# Patient Record
Sex: Female | Born: 1965 | Race: White | Hispanic: No | Smoking: Never smoker
Health system: Southern US, Community
[De-identification: ages and names within clinical notes are randomized; demographics above are authoritative.]

## PROBLEM LIST (undated history)

## (undated) DIAGNOSIS — N926 Irregular menstruation, unspecified: Secondary | ICD-10-CM

## (undated) DIAGNOSIS — I10 Essential (primary) hypertension: Secondary | ICD-10-CM

## (undated) DIAGNOSIS — F32A Depression, unspecified: Secondary | ICD-10-CM

## (undated) DIAGNOSIS — F419 Anxiety disorder, unspecified: Secondary | ICD-10-CM

## (undated) DIAGNOSIS — F329 Major depressive disorder, single episode, unspecified: Secondary | ICD-10-CM

## (undated) DIAGNOSIS — K219 Gastro-esophageal reflux disease without esophagitis: Secondary | ICD-10-CM

## (undated) DIAGNOSIS — E785 Hyperlipidemia, unspecified: Secondary | ICD-10-CM

## (undated) DIAGNOSIS — E079 Disorder of thyroid, unspecified: Secondary | ICD-10-CM

## (undated) HISTORY — DX: Essential (primary) hypertension: I10

## (undated) HISTORY — DX: Irregular menstruation, unspecified: N92.6

## (undated) HISTORY — PX: TUBAL LIGATION: SHX77

## (undated) HISTORY — DX: Major depressive disorder, single episode, unspecified: F32.9

## (undated) HISTORY — PX: ABDOMINAL HYSTERECTOMY: SHX81

## (undated) HISTORY — DX: Gastro-esophageal reflux disease without esophagitis: K21.9

## (undated) HISTORY — PX: ENDOMETRIAL ABLATION: SHX621

## (undated) HISTORY — DX: Depression, unspecified: F32.A

## (undated) HISTORY — PX: CHOLECYSTECTOMY: SHX55

## (undated) HISTORY — PX: DILATION AND CURETTAGE OF UTERUS: SHX78

## (undated) HISTORY — DX: Hyperlipidemia, unspecified: E78.5

## (undated) HISTORY — DX: Disorder of thyroid, unspecified: E07.9

## (undated) HISTORY — DX: Anxiety disorder, unspecified: F41.9

---

## 2006-07-29 ENCOUNTER — Ambulatory Visit (HOSPITAL_COMMUNITY): Admission: RE | Admit: 2006-07-29 | Discharge: 2006-07-29 | Payer: Self-pay | Admitting: Obstetrics & Gynecology

## 2006-07-29 ENCOUNTER — Encounter (INDEPENDENT_AMBULATORY_CARE_PROVIDER_SITE_OTHER): Payer: Self-pay | Admitting: Specialist

## 2006-11-12 ENCOUNTER — Emergency Department (HOSPITAL_COMMUNITY): Admission: EM | Admit: 2006-11-12 | Discharge: 2006-11-12 | Payer: Self-pay | Admitting: Emergency Medicine

## 2007-02-09 ENCOUNTER — Other Ambulatory Visit: Admission: RE | Admit: 2007-02-09 | Discharge: 2007-02-09 | Payer: Self-pay | Admitting: Obstetrics and Gynecology

## 2007-10-07 ENCOUNTER — Emergency Department (HOSPITAL_COMMUNITY): Admission: EM | Admit: 2007-10-07 | Discharge: 2007-10-07 | Payer: Self-pay | Admitting: Emergency Medicine

## 2008-02-14 ENCOUNTER — Other Ambulatory Visit: Admission: RE | Admit: 2008-02-14 | Discharge: 2008-02-14 | Payer: Self-pay | Admitting: Obstetrics and Gynecology

## 2008-07-17 ENCOUNTER — Ambulatory Visit (HOSPITAL_COMMUNITY): Admission: RE | Admit: 2008-07-17 | Discharge: 2008-07-17 | Payer: Self-pay | Admitting: Family Medicine

## 2008-07-19 ENCOUNTER — Ambulatory Visit (HOSPITAL_COMMUNITY): Admission: RE | Admit: 2008-07-19 | Discharge: 2008-07-19 | Payer: Self-pay | Admitting: General Surgery

## 2008-07-20 ENCOUNTER — Encounter (INDEPENDENT_AMBULATORY_CARE_PROVIDER_SITE_OTHER): Payer: Self-pay | Admitting: General Surgery

## 2008-07-20 ENCOUNTER — Observation Stay (HOSPITAL_COMMUNITY): Admission: RE | Admit: 2008-07-20 | Discharge: 2008-07-21 | Payer: Self-pay | Admitting: General Surgery

## 2009-02-22 ENCOUNTER — Other Ambulatory Visit: Admission: RE | Admit: 2009-02-22 | Discharge: 2009-02-22 | Payer: Self-pay | Admitting: Obstetrics and Gynecology

## 2010-05-19 ENCOUNTER — Encounter: Payer: Self-pay | Admitting: Family Medicine

## 2010-08-08 LAB — HEPATIC FUNCTION PANEL
ALT: 21 U/L (ref 0–35)
ALT: 30 U/L (ref 0–35)
AST: 19 U/L (ref 0–37)
Alkaline Phosphatase: 45 U/L (ref 39–117)
Alkaline Phosphatase: 58 U/L (ref 39–117)
Bilirubin, Direct: 0.1 mg/dL (ref 0.0–0.3)
Indirect Bilirubin: 0.3 mg/dL (ref 0.3–0.9)
Indirect Bilirubin: 0.3 mg/dL (ref 0.3–0.9)
Total Bilirubin: 0.4 mg/dL (ref 0.3–1.2)
Total Protein: 5.1 g/dL — ABNORMAL LOW (ref 6.0–8.3)
Total Protein: 6.2 g/dL (ref 6.0–8.3)

## 2010-08-08 LAB — CBC
HCT: 35 % — ABNORMAL LOW (ref 36.0–46.0)
HCT: 38.2 % (ref 36.0–46.0)
Hemoglobin: 12.1 g/dL (ref 12.0–15.0)
MCV: 89.2 fL (ref 78.0–100.0)
MCV: 90.4 fL (ref 78.0–100.0)
Platelets: 258 10*3/uL (ref 150–400)
Platelets: 289 10*3/uL (ref 150–400)
RBC: 3.87 MIL/uL (ref 3.87–5.11)
RBC: 4.28 MIL/uL (ref 3.87–5.11)
WBC: 10 10*3/uL (ref 4.0–10.5)
WBC: 6.7 10*3/uL (ref 4.0–10.5)

## 2010-08-08 LAB — DIFFERENTIAL
Eosinophils Absolute: 0 10*3/uL (ref 0.0–0.7)
Eosinophils Absolute: 0.1 10*3/uL (ref 0.0–0.7)
Eosinophils Relative: 0 % (ref 0–5)
Eosinophils Relative: 2 % (ref 0–5)
Lymphocytes Relative: 26 % (ref 12–46)
Lymphocytes Relative: 32 % (ref 12–46)
Lymphs Abs: 2.1 10*3/uL (ref 0.7–4.0)
Lymphs Abs: 2.6 10*3/uL (ref 0.7–4.0)
Monocytes Absolute: 0.7 10*3/uL (ref 0.1–1.0)
Monocytes Relative: 7 % (ref 3–12)
Monocytes Relative: 7 % (ref 3–12)

## 2010-08-08 LAB — BASIC METABOLIC PANEL
Chloride: 104 mEq/L (ref 96–112)
Chloride: 111 mEq/L (ref 96–112)
GFR calc Af Amer: >60 mL/min (ref >60)
GFR calc Af Amer: >60 mL/min (ref >60)
GFR calc non Af Amer: >60 mL/min (ref >60)
GFR calc non Af Amer: >60 mL/min (ref >60)
Potassium: 4.2 mEq/L (ref 3.5–5.1)
Potassium: 4.5 mEq/L (ref 3.5–5.1)
Sodium: 137 mEq/L (ref 135–145)
Sodium: 140 mEq/L (ref 135–145)

## 2010-08-08 LAB — URINALYSIS, ROUTINE W REFLEX MICROSCOPIC
Bilirubin Urine: NEGATIVE
Ketones, ur: NEGATIVE mg/dL
Nitrite: NEGATIVE
Specific Gravity, Urine: 1.01 (ref 1.005–1.030)
pH: 6 (ref 5.0–8.0)

## 2010-08-08 LAB — URINE MICROSCOPIC-ADD ON

## 2010-08-08 LAB — HCG, QUANTITATIVE, PREGNANCY: hCG, Beta Chain, Quant, S: <2 m[IU]/mL (ref <5)

## 2010-09-10 NOTE — Op Note (Signed)
NAMEHOANG, Nancy [name]            ACCOUNT NO.:  [id number]   MEDICAL RECORD NO.:  [id number]          PATIENT TYPE:  OIB   LOCATION:  A329                          FACILITY:  [facility]   PHYSICIAN:  Barbaraann Barthel, M.D. DATE OF BIRTH:  [birth year]   DATE OF PROCEDURE:  07/20/2008  DATE OF DISCHARGE:                               OPERATIVE REPORT   SURGEON:  Barbaraann Barthel, MD   PREOPERATIVE DIAGNOSES:  Cholecystitis, questionable polyp versus stone  in the gallbladder, final pathology pending.   PROCEDURE:  Laparoscopic cholecystectomy.   SPECIMEN:  Gallbladder.   WOUND CLASSIFICATION:  Contaminated (minimal bile spillage).   NOTE:  This is a 45 year old white female who had at least a year's  complaints of right upper quadrant pain and nausea without vomiting.  She was referred when she was noted on sonogram to have, what appeared  to be, an 8-mm polyp within the gallbladder.  When I swa her in my  office, she also had symptoms of right upper quadrant pain that  suggested to me the possibility of a renal component, because this pain  radiated to her groin.  I thought that her symptoms were very suggestive  of nephrolithiasis.  I did obtain a CT scan.  The CT scan, however, did  not show kidney stones; however, she did have trace blood noted in her  urine.  I suspect that she may have passed a stone and she may have done  this in the past.  This still did not explain entirely her postprandial  pain and nausea and the fact that she had, what appeared to be, an 8-mm  polyp.  I still felt that she needed surgery for this.  I discussed this  in detail with the patient, discussing complications not limited to, but  including bleeding, infection, damage to bile ducts, perforation of  organs, transitory diarrhea, and the possibility of open cholecystectomy  might be necessary.  I also stated that she may have recurrent problems  with pain, because she  may have, as I still suspect,  another component  in the nature of kidney stones.   GROSS OPERATIVE FINDINGS:  In right upper quadrant, there was some small  cystic duct.  This was not cannulated.  The right upper quadrant  otherwise appeared to be grossly within normal limits.  There were  minimal adhesions.   TECHNIQUE:  The patient was placed in supine position.  After the  adequate administration of general anesthesia via endotracheal  intubation, a Foley catheter was aseptically inserted.  Her abdomen was  prepped with Betadine solution and draped in the usual manner.  A  periumbilical incision was carried out over the superior aspect of the  umbilicus.  The fascia was grasped with a sharp towel clip and elevated  and a Veress needle was inserted and confirmed the position with a  saline drop test.  We then used the Visiport technique and an 11-mm  cannula was placed in the umbilicus and then under direct vision, the 11-  mm cannula was placed in the epigastrium and 5-mm cannulas to the right  upper  quadrant laterally.  The gallbladder was grasped, adhesions were  taken down the cystic duct and cystic artery were clearly visualized,  triply silver clipped and divided, and the gallbladder was then removed  with minimal spillage from the liver bed.  This was then exited using  the Endocatch device.  We then checked for hemostasis, deemed complete.  We irrigated.  I elected to leave a piece of Surgicel on the liver bed  as well as a drain which exited from one of the lateral 5-mm cannula  sites.  Prior to closure, all sponge, needle, and instrument counts were  found to be correct.  Estimated blood loss was minimal.  The patient  received a liter of crystalloids intraoperatively.  There were no  complications.       Barbaraann Barthel, M.D.  Electronically Signed     WB/MEDQ  D:  07/20/2008  T:  07/20/2008  Job:  [id number]   cc:   Patrica Duel, M.D.  Fax: [phone]

## 2010-09-13 NOTE — H&P (Signed)
NAME:  Nancy [name], Nancy [name]               ACCOUNT NO.:  [id number]   MEDICAL RECORD NO.:  [id number]          PATIENT TYPE:  AMB   LOCATION:                                FACILITY:  [facility]   PHYSICIAN:  Lazaro Arms, M.D.   DATE OF BIRTH:  19-Sep-1965   DATE OF ADMISSION:  DATE OF DISCHARGE:  LH                              HISTORY & PHYSICAL   Yardley is a 45 year old white female, gravida IV, para II, abortus II,  status post tubal ligation in 2000, who is admitted for a hysteroscopy,  D&C, endometrial ablation.  She bleeds for 10-14 days at a time,  heavily.  She requires pads as well as tampons.  She soils clothes,  sheets, and usually goes through 24 tampons in two days.  Her hemoglobin  in the office was 10.4.  Her uterus is normal size on exam.  She  declines any hormonal management.   PAST MEDICAL HISTORY:  Negative.   PAST SURGICAL HISTORY:  She had a D&C in 1993, tubal ligation in 2000.   PAST OBSTETRICAL HISTORY:  Two vaginal deliveries and two miscarriages.   REVIEW OF SYSTEMS:  Otherwise, negative.   CURRENT MEDICATIONS:  Celexa for premenstrual dysphoric disorder.   PHYSICAL EXAMINATION:  VITAL SIGNS:  Her weight is 193 pounds.  Blood  pressure 130/90.  HEENT:  Unremarkable.  Thyroid is normal.  LUNGS:  Clear.  HEART:  Regular rate and rhythm without murmurs, regurg, or gallops.  BREASTS:  Deferred.  ABDOMEN:  Benign.  No hepatosplenomegaly or masses.  PELVIC:  Exam reveals normal uterus, adnexa.  Vagina is pink and moist.  No discharge.  EXTREMITIES:  Warm.  No edema.  NEUROLOGICAL:  Exam is grossly intact.   IMPRESSION:  1. Menometrorrhagia.  2. Dysmenorrhea.   PLAN:  Patient admitted for hysteroscopy, D&C, endometrial ablation.  She understands risks, benefits, indications, alternatives, will  proceed.      Lazaro Arms, M.D.  Electronically Signed     LHE/MEDQ  D:  07/28/2006  T:  07/29/2006  Job:  [id number]

## 2010-09-13 NOTE — Op Note (Signed)
[name], Nancy [name]               ACCOUNT NO.:  [id number]   MEDICAL RECORD NO.:  [id number]          PATIENT TYPE:  AMB   LOCATION:  DAY                           FACILITY:  [facility]   PHYSICIAN:  Lazaro Arms, M.D.   DATE OF BIRTH:  08-02-1965   DATE OF PROCEDURE:  07/29/2006  DATE OF DISCHARGE:                               OPERATIVE REPORT   PREOPERATIVE DIAGNOSIS:  1. Menometrorrhagia.  2. Dysmenorrhea.   POSTOPERATIVE DIAGNOSIS:  1. Menometrorrhagia.  2. Dysmenorrhea.   PROCEDURE:  Hysteroscopy, dilatation and curettage, endometrial  ablation.   SURGEON:  Lazaro Arms, M.D.   ANESTHESIA:  Laryngeal mask airway.   FINDINGS:  Patient had a normal endometrial cavity.  No polyps, no  myomas, just normal what appeared to be secretory phase endometrium.   DESCRIPTION OF OPERATION:  Patient was taken to the operating room and  placed in supine position, where she underwent laryngeal mask airway  anesthesia.  She was placed in dorsal lithotomy position, prepped and  draped in the usual sterile fashion.  A Graves speculum was placed.  The  cervix was grasped with a single-tooth tenaculum.  The cervix was  dilated serially to allow passage of the hysteroscope.  The hysteroscopy  was performed using normal saline.  Normal endometrial cavity was found.  There were no polyps, no submucosal myomas, and no concern about  hyperplasia.  A vigorous uterine curettage was then performed and  achieved good uterine cry in all areas, and that tissue was sent to  pathology for evaluation.  A ThermaChoice 3 endometrial ablation balloon  was used, 18 mL of D5W was required to maintain a pressure of 190-200  mmHg.  Temperature of 88 degrees Celsius was maintained throughout the  procedure.  The total therapy time was 9 minutes and 17 seconds.  All of  the fluid was returned at the end of the procedure.  The patient  tolerated the procedure well.  She was taken to recovery in good stable  condition.  All counts were correct.  She received Ancef and Toradol  preoperatively.      Lazaro Arms, M.D.  Electronically Signed    LHE/MEDQ  D:  07/29/2006  T:  07/29/2006  Job:  [id number]

## 2011-01-23 LAB — URINALYSIS, ROUTINE W REFLEX MICROSCOPIC
Bilirubin Urine: NEGATIVE
Hgb urine dipstick: NEGATIVE
Ketones, ur: NEGATIVE
Specific Gravity, Urine: 1.015
Urobilinogen, UA: 0.2

## 2011-03-06 ENCOUNTER — Other Ambulatory Visit (HOSPITAL_COMMUNITY)
Admission: RE | Admit: 2011-03-06 | Discharge: 2011-03-06 | Disposition: A | Discharge status: Home / Self Care | Payer: BC Managed Care – PPO | Source: Ambulatory Visit | Attending: Obstetrics and Gynecology | Admitting: Obstetrics and Gynecology

## 2011-03-06 ENCOUNTER — Other Ambulatory Visit: Payer: Self-pay | Admitting: Adult Health

## 2011-03-06 DIAGNOSIS — Z01419 Encounter for gynecological examination (general) (routine) without abnormal findings: Secondary | ICD-10-CM | POA: Insufficient documentation

## 2011-03-10 ENCOUNTER — Other Ambulatory Visit: Payer: Self-pay | Admitting: Adult Health

## 2011-03-10 DIAGNOSIS — Z139 Encounter for screening, unspecified: Secondary | ICD-10-CM

## 2011-03-24 ENCOUNTER — Ambulatory Visit (HOSPITAL_COMMUNITY)
Admission: RE | Admit: 2011-03-24 | Discharge: 2011-03-24 | Disposition: A | Discharge status: Home / Self Care | Payer: BC Managed Care – PPO | Source: Ambulatory Visit | Attending: Adult Health | Admitting: Adult Health

## 2011-03-24 DIAGNOSIS — Z1231 Encounter for screening mammogram for malignant neoplasm of breast: Secondary | ICD-10-CM | POA: Insufficient documentation

## 2011-03-24 DIAGNOSIS — Z139 Encounter for screening, unspecified: Secondary | ICD-10-CM

## 2012-03-08 ENCOUNTER — Other Ambulatory Visit (HOSPITAL_COMMUNITY)
Admission: RE | Admit: 2012-03-08 | Discharge: 2012-03-08 | Disposition: A | Discharge status: Home / Self Care | Payer: BC Managed Care – PPO | Source: Ambulatory Visit | Attending: Obstetrics and Gynecology | Admitting: Obstetrics and Gynecology

## 2012-03-08 ENCOUNTER — Other Ambulatory Visit: Payer: Self-pay | Admitting: Adult Health

## 2012-03-08 DIAGNOSIS — Z1151 Encounter for screening for human papillomavirus (HPV): Secondary | ICD-10-CM | POA: Insufficient documentation

## 2012-03-08 DIAGNOSIS — Z01419 Encounter for gynecological examination (general) (routine) without abnormal findings: Secondary | ICD-10-CM | POA: Insufficient documentation

## 2012-03-09 ENCOUNTER — Other Ambulatory Visit: Payer: Self-pay | Admitting: Adult Health

## 2012-03-09 DIAGNOSIS — Z139 Encounter for screening, unspecified: Secondary | ICD-10-CM

## 2012-03-29 ENCOUNTER — Ambulatory Visit (HOSPITAL_COMMUNITY)
Admission: RE | Admit: 2012-03-29 | Discharge: 2012-03-29 | Disposition: A | Discharge status: Home / Self Care | Payer: BC Managed Care – PPO | Source: Ambulatory Visit | Attending: Adult Health | Admitting: Adult Health

## 2012-03-29 DIAGNOSIS — Z1231 Encounter for screening mammogram for malignant neoplasm of breast: Secondary | ICD-10-CM | POA: Insufficient documentation

## 2012-03-29 DIAGNOSIS — Z139 Encounter for screening, unspecified: Secondary | ICD-10-CM

## 2013-03-10 ENCOUNTER — Ambulatory Visit (INDEPENDENT_AMBULATORY_CARE_PROVIDER_SITE_OTHER): Payer: BC Managed Care – PPO | Admitting: Adult Health

## 2013-03-10 ENCOUNTER — Encounter: Payer: Self-pay | Admitting: Adult Health

## 2013-03-10 VITALS — BP 120/72 | HR 78 | Ht 66.0 in | Wt 245.0 lb

## 2013-03-10 DIAGNOSIS — Z30432 Encounter for removal of intrauterine contraceptive device: Secondary | ICD-10-CM

## 2013-03-10 DIAGNOSIS — N926 Irregular menstruation, unspecified: Secondary | ICD-10-CM

## 2013-03-10 DIAGNOSIS — Z01419 Encounter for gynecological examination (general) (routine) without abnormal findings: Secondary | ICD-10-CM

## 2013-03-10 DIAGNOSIS — Z1212 Encounter for screening for malignant neoplasm of rectum: Secondary | ICD-10-CM

## 2013-03-10 DIAGNOSIS — F419 Anxiety disorder, unspecified: Secondary | ICD-10-CM

## 2013-03-10 HISTORY — DX: Irregular menstruation, unspecified: N92.6

## 2013-03-10 LAB — HEMOCCULT GUIAC POC 1CARD (OFFICE)

## 2013-03-10 MED ORDER — CITALOPRAM HYDROBROMIDE 40 MG PO TABS: 40.0000 mg | ORAL_TABLET | Freq: Every day | ORAL | Status: DC

## 2013-03-10 NOTE — Patient Instructions (Signed)
Physical in 1 year Mammogram yearly Labs at work colonoscopy at 51

## 2013-03-10 NOTE — Progress Notes (Signed)
Patient ID: Nancy [name], female   DOB: October 04, 1965, 47 y.o.   MRN: [id number] History of Present Illness: Nancy [name] is a 47 year old white female, married in for a physical.She had a normal pap with negative HPV 03/08/12.She is having irregular bleeding, and is sp BTL,ablation and has IUD, and wants the IUD removed.Also thinks the celexa not working as well.   Current Medications, Allergies, Past Medical History, Past Surgical History, Family History and Social History were reviewed in [employer] record.     Review of Systems: Patient denies any headaches, blurred vision, shortness of breath, chest pain, abdominal pain, problems with bowel movements, urination, or intercourse. No joint pain, but moods OK but mind races and can't sleep as well.    Physical Exam:BP 120/72  Pulse 78  Ht 5\' 6"  (1.676 m)  Wt 245 lb (111.131 kg)  BMI 39.56 kg/m2  LMP 02/11/2013 General:  Well developed, well nourished, no acute distress Skin:  Warm and dry, tan Neck:  Midline trachea, normal thyroid Lungs; Clear to auscultation bilaterally Breast:  No dominant palpable mass, retraction, or nipple discharge Cardiovascular: Regular rate and rhythm Abdomen:  Soft, non tender, no hepatosplenomegaly Pelvic:  External genitalia is normal in appearance.  The vagina is normal in appearance. The cervix is bulbous. IUD removed with forceps easily. Uterus is felt to be normal size, shape, and contour.  No          adnexal masses or tenderness noted. Rectal: Good sphincter tone, no polyps, or hemorrhoids felt.  Hemoccult negative. Extremities:  No swelling or varicosities noted Psych:  Alert and cooperative,seems happy   Impression: Yearly gyn exam  Irregular bleeding IUD removed Anxiety     Plan: Will increase celexa to 40 mg with 6 refills, if not better in 4-6 weeks call will change meds Physical in 1 year Mammogram yearly Colonoscopy at 51 Labs at work

## 2013-05-23 ENCOUNTER — Ambulatory Visit (INDEPENDENT_AMBULATORY_CARE_PROVIDER_SITE_OTHER): Payer: BC Managed Care – PPO | Admitting: Obstetrics & Gynecology

## 2013-05-23 ENCOUNTER — Encounter: Payer: Self-pay | Admitting: Obstetrics & Gynecology

## 2013-05-23 VITALS — BP 140/100 | Ht 67.0 in | Wt 236.0 lb

## 2013-05-23 DIAGNOSIS — N939 Abnormal uterine and vaginal bleeding, unspecified: Secondary | ICD-10-CM

## 2013-05-23 DIAGNOSIS — N898 Other specified noninflammatory disorders of vagina: Secondary | ICD-10-CM

## 2013-05-23 DIAGNOSIS — N926 Irregular menstruation, unspecified: Secondary | ICD-10-CM

## 2013-05-23 LAB — POCT HEMOGLOBIN: Hemoglobin: 15 g/dL (ref 12.2–16.2)

## 2013-05-23 MED ORDER — MEGESTROL ACETATE 40 MG PO TABS: ORAL_TABLET | ORAL | Status: DC

## 2013-05-23 NOTE — Progress Notes (Signed)
Patient ID: Aaira Oestreicher, female   DOB: November 22, 1965, 48 y.o.   MRN: [id number] Pt has bled "42 out of last 74 days" since she got her IUD out S/p ablation in 2008 and had IUD to manage bleeding beyond that  Will place on megestrol and follow up with sonogram in 1 month and decide based on that what to do going forward  Past Medical History  Diagnosis Date  . Anxiety   . Irregular bleeding 03/10/2013    Past Surgical History  Procedure Laterality Date  . Tubal ligation    . Dilation and curettage of uterus    . Endometrial ablation      OB History   Grav Para Term Preterm Abortions TAB SAB Ect Mult Living   4 2   2  2   2       No Known Allergies  History   Social History  . Marital Status: Divorced    Spouse Name: N/A    Number of Children: N/A  . Years of Education: N/A   Social History Main Topics  . Smoking status: Never Smoker   . Smokeless tobacco: Never Used  . Alcohol Use: No  . Drug Use: No  . Sexual Activity: Yes    Birth Control/ Protection: Surgical   Other Topics Concern  . None   Social History Narrative  . None    Family History  Problem Relation Age of Onset  . Heart disease Mother   . COPD Mother   . Heart attack Father   . Cancer Father     lung and renal  . Hypertension Maternal Grandmother   . Heart disease Maternal Grandmother   . Hypertension Maternal Grandfather   . Heart disease Maternal Grandfather   . Hypertension Paternal Grandmother   . Heart disease Paternal Grandmother   . Hypertension Paternal Grandfather   . Heart disease Paternal Grandfather   . Cancer Sister     lung,renal

## 2013-06-23 ENCOUNTER — Other Ambulatory Visit: Payer: BC Managed Care – PPO

## 2013-06-23 ENCOUNTER — Ambulatory Visit: Payer: BC Managed Care – PPO | Admitting: Obstetrics & Gynecology

## 2013-06-27 ENCOUNTER — Encounter (HOSPITAL_COMMUNITY): Payer: Self-pay | Admitting: Pharmacy Technician

## 2013-06-27 ENCOUNTER — Ambulatory Visit (INDEPENDENT_AMBULATORY_CARE_PROVIDER_SITE_OTHER): Payer: BC Managed Care – PPO | Admitting: Obstetrics & Gynecology

## 2013-06-27 ENCOUNTER — Encounter (INDEPENDENT_AMBULATORY_CARE_PROVIDER_SITE_OTHER): Payer: Self-pay

## 2013-06-27 ENCOUNTER — Other Ambulatory Visit: Payer: Self-pay | Admitting: Obstetrics & Gynecology

## 2013-06-27 ENCOUNTER — Encounter: Payer: Self-pay | Admitting: Obstetrics & Gynecology

## 2013-06-27 ENCOUNTER — Ambulatory Visit (INDEPENDENT_AMBULATORY_CARE_PROVIDER_SITE_OTHER): Payer: BC Managed Care – PPO

## 2013-06-27 VITALS — BP 120/84 | Ht 67.0 in | Wt 234.0 lb

## 2013-06-27 DIAGNOSIS — N898 Other specified noninflammatory disorders of vagina: Secondary | ICD-10-CM

## 2013-06-27 DIAGNOSIS — N939 Abnormal uterine and vaginal bleeding, unspecified: Secondary | ICD-10-CM

## 2013-06-27 DIAGNOSIS — N920 Excessive and frequent menstruation with regular cycle: Secondary | ICD-10-CM

## 2013-06-27 DIAGNOSIS — N946 Dysmenorrhea, unspecified: Secondary | ICD-10-CM

## 2013-06-27 DIAGNOSIS — D219 Benign neoplasm of connective and other soft tissue, unspecified: Secondary | ICD-10-CM

## 2013-06-27 DIAGNOSIS — D259 Leiomyoma of uterus, unspecified: Secondary | ICD-10-CM

## 2013-06-27 DIAGNOSIS — N926 Irregular menstruation, unspecified: Secondary | ICD-10-CM

## 2013-06-27 DIAGNOSIS — Z01818 Encounter for other preprocedural examination: Secondary | ICD-10-CM

## 2013-06-27 NOTE — Progress Notes (Signed)
Patient ID: Nancy [name], female   DOB: 10/24/1965, 48 y.o.   MRN: [id number] See sonogram report    US Transvaginal Non-ob  06/27/2013   GYNECOLOGIC SONOGRAM   Nancy [name] is a 48 y.o. [id number] LMP 06/17/2013 for a pelvic sonogram  for DUB and is presently taking Megace. Pt with H/O Endometrial Ablation,  BTL and D&C.  Uterus                      15.1 x 10.9 x 10.6 cm, retroflexed uterus with  multiple fibroids noted (6 in # and largest 2 = 48 & 55mm)  Endometrium          9.6 mm, distorted by multiple fibroids  Right ovary             2.9 x 2.0 x 1.6 cm,   Left ovary                2.6 x 2.0 x 1.8 cm,   No free fluid or adnexal masses noted within pelvis  Technician Comments:  Retroflexed uterus noted with multiple fibroids, Endometrial cavity  distorted by fibroids, bilateral adnexa/ovaries appears wnl, no free fluid  or adnexal masses noted within pelvis   Nancy [name] 06/27/2013 10:04 AM  Clinical Impression and recommendations:  I have reviewed the sonogram results above, combined with the patient's  current clinical course, below are my impressions and any appropriate  recommendations for management based on the sonographic findings.  Multiple fibroids with submucosal location as well, which is the reason  for her bleeding No other anormalities  EURE,LUTHER H 06/27/2013 10:29 AM      Exam  Uterus feels about 14-16 weeks size, not option for vaginal or laparoscopic approach  Proceed with abdominal supracervical hysterectomy with removal of tubes and ovaries  Past Medical History  Diagnosis Date  . Anxiety   . Irregular bleeding 03/10/2013    Past Surgical History  Procedure Laterality Date  . Tubal ligation    . Dilation and curettage of uterus    . Endometrial ablation      OB History   Grav Para Term Preterm Abortions TAB SAB Ect Mult Living   4 2   2  2   2       No Known Allergies  History   Social History  . Marital Status: Divorced    Spouse Name: N/A    Number of  Children: N/A  . Years of Education: N/A   Social History Main Topics  . Smoking status: Never Smoker   . Smokeless tobacco: Never Used  . Alcohol Use: No  . Drug Use: No  . Sexual Activity: Yes    Birth Control/ Protection: Surgical   Other Topics Concern  . None   Social History Narrative  . None    Family History  Problem Relation Age of Onset  . Heart disease Mother   . COPD Mother   . Heart attack Father   . Cancer Father     lung and renal  . Hypertension Maternal Grandmother   . Heart disease Maternal Grandmother   . Hypertension Maternal Grandfather   . Heart disease Maternal Grandfather   . Hypertension Paternal Grandmother   . Heart disease Paternal Grandmother   . Hypertension Paternal Grandfather   . Heart disease Paternal Grandfather   . Cancer Sister     lung,renal

## 2013-06-29 NOTE — Patient Instructions (Signed)
Nancy [name]  06/29/2013   Your procedure is scheduled on:  07/06/2013  Report to [facility] at  700  AM.  Call this number if you have problems the morning of surgery: [phone]   Remember:   Do not eat food or drink liquids after midnight.   Take these medicines the morning of surgery with A SIP OF WATER: none   Do not wear jewelry, make-up or nail polish.  Do not wear lotions, powders, or perfumes.   Do not shave 48 hours prior to surgery. Men may shave face and neck.  Do not bring valuables to the hospital.  [facility] is not responsible for any belongings or valuables.               Contacts, dentures or bridgework may not be worn into surgery.  Leave suitcase in the car. After surgery it may be brought to your room.  For patients admitted to the hospital, discharge time is determined by your treatment team.               Patients discharged the day of surgery will not be allowed to drive home.  Name and phone number of your driver: family  Special Instructions: Shower using CHG 2 nights before surgery and the night before surgery.  If you shower the day of surgery use CHG.  Use special wash - you have one bottle of CHG for all showers.  You should use approximately 1/3 of the bottle for each shower.   Please read over the following fact sheets that you were given: Pain Booklet, Coughing and Deep Breathing, Surgical Site Infection Prevention, Anesthesia Post-op Instructions and Care and Recovery After Surgery Bilateral Salpingo-Oophorectomy Bilateral salpingo-oophorectomy is the surgical removal of both fallopian tubes and both ovaries. The ovaries are small organs that produce eggs in women. The fallopian tubes transport the egg from the ovary to the womb (uterus). Usually, when this surgery is done, the uterus was previously removed. A bilateral salpingo-oophorectomy may be done to treat cancer or to reduce the risk of cancer in women who are at high  risk. Removing both fallopian tubes and both ovaries will make you unable to become pregnant (sterile). It will also put you into menopause so that you will no longer have menstrual periods and may have menopausal symptoms such as hot flashes, night sweats, and mood changes. It will not affect your sex drive. LET [facility] CARE PROVIDER KNOW ABOUT:  Any allergies you have.  All medicines you are taking, including vitamins, herbs, eye drops, creams, and over-the-counter medicines.  Previous problems you or members of your family have had with the use of anesthetics.  Any blood disorders you have.  Previous surgeries you have had.  Medical conditions you have. RISKS AND COMPLICATIONS Generally, this is a safe procedure. However, as with any procedure, complications can occur. Possible complications include:  Injury to surrounding organs.  Bleeding.  Infection.  Blood clots in the legs or lungs.  Problems related to anesthesia. BEFORE THE PROCEDURE  Ask your health care provider about changing or stopping your regular medicines. You may need to stop taking certain medicines, such as aspirin or blood thinners, at least 1 week before the surgery.  Do not eat or drink anything for at least 8 hours before the surgery.  If you smoke, do not smoke for at least 2 weeks before the surgery.  Make plans to have someone drive you  home after the procedure or after your hospital stay. Also arrange for someone to help you with activities during recovery. PROCEDURE   You will be given medicine to help you relax before the procedure (sedative). You will then be given medicine to make you sleep through the procedure (general anesthetic). These medicines will be given through an IV access tube that is put into one of your veins.  Once you are asleep, your lower abdomen will be shaved and cleaned. A thin, flexible tube (catheter) will be placed in your bladder.  The surgeon may use a  laparoscopic, robotic, or open technique for this surgery:  In the laparoscopic technique, the surgery is done through two small cuts (incisions) in the abdomen. A thin, lighted tube with a tiny camera on the end (laparoscope) is inserted into one of the incisions. The tools needed for the procedure are put through the other incision.  A robotic technique may be chosen to perform complex surgery in a small space. In the robotic technique, small incisions will be made. A camera and surgical instruments are passed through the incisions. Surgical instruments will be controlled with the help of a robotic arm.  In the open technique, the surgery is done through one large incision in the abdomen.  Using any of these techniques, the surgeon removes the fallopian tubes and ovaries. The blood vessels will be clamped and tied.  The surgeon then uses staples or stitches to close the incision or incisions. AFTER THE PROCEDURE  You will be taken to a recovery area where you will be monitored for 1 to 3 hours. Your blood pressure, pulse, and temperature will be checked often. You will remain in the recovery area until you are stable and waking up.  If the laparoscopic technique was used, you may be allowed to go home after several hours. You may have some shoulder pain after the laparoscopic procedure. This is normal and usually goes away in a day or two.  If the open technique was used, you will be admitted to the hospital for a couple of days.  You will be given pain medicine as needed.  The IV access tube and catheter will be removed before you are discharged. Document Released: 04/14/2005 Document Revised: 12/15/2012 Document Reviewed: 10/06/2012 [facility] Patient Information 2014 [place]. Supracervical Hysterectomy A supracervical hysterectomy is surgery to remove the top part of the uterus, but not the cervix. You will no longer have menstrual periods or be able to get pregnant after this  surgery. The fallopian tubes and ovaries may also be removed (bilateral salpingo-oophorectomy) during this surgery. This surgery is usually performed using a minimally invasive technique called laparoscopy. This technique allows the surgery to be done through small incisions. The minimally invasive technique provides benefits such as less pain, less risk of infection, and shorter recovery time. LET [facility] CARE PROVIDER KNOW ABOUT:  Any allergies you have.  All medicines you are taking, including vitamins, herbs, eye drops, creams, and over-the-counter medicines.  Previous problems you or members of your family have had with the use of anesthetics.  Any blood disorders you have.  Previous surgeries you have had.  Medical conditions you have. RISKS AND COMPLICATIONS  Generally, this is a safe procedure. However, as with any procedure, complications can occur. Possible complications include:  Bleeding.  Blood clots in the legs or lung.  Infection.  Injury to surrounding organs.  Problems related to anesthesia.  Conversion to an open abdominal surgery.  Additional surgery  later to remove the cervix if you have problems with the cervix. BEFORE THE PROCEDURE  Ask your health care provider about changing or stopping your regular medicines.  Do not take aspirin or blood thinners (anticoagulants) for 1 week before the surgery, or as directed by your health care provider.  Do not eat or drink anything for 8 hours before the surgery, or as directed by your health care provider.  Quit smoking if you smoke.  Arrange for a ride home after surgery and for someone to help you at home during recovery. PROCEDURE   You will be given an antibiotic medicine.  An IV tube will be placed in one of your veins. You will be given medicine to make you sleep (general anesthetic).  A gas (carbon dioxide) will be used to inflate your abdomen. This will allow your surgeon to look inside your  abdomen, perform your surgery, and treat any other problems found if necessary.  Three or four small incisions will be made in your abdomen. One of these incisions will be made in the area of your belly button (navel). A thin, flexible tube with a tiny camera and light on the end of it (laparoscope) will be inserted into the incision. The camera on the laparoscope sends a picture to a TV screen in the operating room. This gives your surgeon a good view inside the abdomen.  Other surgical instruments will be inserted through the other incisions.  The uterus will be cut into small pieces and removed through the small incisions.  Your incisions will be closed. AFTER THE PROCEDURE   You will be taken to a recovery area where your progress will be monitored until you are awake, stable, and taking fluids well. If there are no other problems, you will then be moved to a regular hospital room, or you will be allowed to go home.  You will likely have minimal discomfort after the surgery because the incisions are so small with the laparoscopic technique.  You will be given pain medicine while you are in the hospital and for when you go home.  If a bilateral salpingo-oophorectomy was performed before menopause, you will go through a sudden (abrupt) menopause. This can be helped with hormone medicines. Document Released: 10/01/2007 Document Revised: 02/02/2013 Document Reviewed: 10/15/2012 [facility] Patient Information 2014 [place]. PATIENT INSTRUCTIONS POST-ANESTHESIA  IMMEDIATELY FOLLOWING SURGERY:  Do not drive or operate machinery for the first twenty four hours after surgery.  Do not make any important decisions for twenty four hours after surgery or while taking narcotic pain medications or sedatives.  If you develop intractable nausea and vomiting or a severe headache please notify your doctor immediately.  FOLLOW-UP:  Please make an appointment with your surgeon as instructed. You do not  need to follow up with anesthesia unless specifically instructed to do so.  WOUND CARE INSTRUCTIONS (if applicable):  Keep a dry clean dressing on the anesthesia/puncture wound site if there is drainage.  Once the wound has quit draining you may leave it open to air.  Generally you should leave the bandage intact for twenty four hours unless there is drainage.  If the epidural site drains for more than 36-48 hours please call the anesthesia department.  QUESTIONS?:  Please feel free to call your physician or the hospital operator if you have any questions, and they will be happy to assist you.

## 2013-06-30 ENCOUNTER — Encounter (HOSPITAL_COMMUNITY)
Admission: RE | Admit: 2013-06-30 | Discharge: 2013-06-30 | Disposition: A | Discharge status: Home / Self Care | Payer: BC Managed Care – PPO | Source: Ambulatory Visit | Attending: Obstetrics & Gynecology | Admitting: Obstetrics & Gynecology

## 2013-06-30 DIAGNOSIS — Z01812 Encounter for preprocedural laboratory examination: Secondary | ICD-10-CM | POA: Insufficient documentation

## 2013-06-30 LAB — COMPREHENSIVE METABOLIC PANEL
ALT: 42 U/L — AB (ref 0–35)
AST: 24 U/L (ref 0–37)
Albumin: 4.1 g/dL (ref 3.5–5.2)
Alkaline Phosphatase: 46 U/L (ref 39–117)
BUN: 9 mg/dL (ref 6–23)
CO2: 26 meq/L (ref 19–32)
CREATININE: 0.77 mg/dL (ref 0.50–1.10)
Calcium: 9.9 mg/dL (ref 8.4–10.5)
Chloride: 107 mEq/L (ref 96–112)
GFR calc Af Amer: >90 mL/min (ref >90)
Glucose, Bld: 104 mg/dL — ABNORMAL HIGH (ref 70–99)
POTASSIUM: 4.5 meq/L (ref 3.7–5.3)
Sodium: 144 mEq/L (ref 137–147)
Total Bilirubin: 0.3 mg/dL (ref 0.3–1.2)
Total Protein: 7.1 g/dL (ref 6.0–8.3)

## 2013-06-30 LAB — URINE MICROSCOPIC-ADD ON

## 2013-06-30 LAB — CBC
HCT: 39 % (ref 36.0–46.0)
Hemoglobin: 13.9 g/dL (ref 12.0–15.0)
MCH: 30.9 pg (ref 26.0–34.0)
MCHC: 35.6 g/dL (ref 30.0–36.0)
MCV: 86.7 fL (ref 78.0–100.0)
PLATELETS: 243 10*3/uL (ref 150–400)
RBC: 4.5 MIL/uL (ref 3.87–5.11)
RDW: 12.6 % (ref 11.5–15.5)
WBC: 7.3 10*3/uL (ref 4.0–10.5)

## 2013-06-30 LAB — URINALYSIS, ROUTINE W REFLEX MICROSCOPIC
Bilirubin Urine: NEGATIVE
Glucose, UA: NEGATIVE mg/dL
Ketones, ur: NEGATIVE mg/dL
LEUKOCYTES UA: NEGATIVE
Nitrite: NEGATIVE
PH: 5.5 (ref 5.0–8.0)
Protein, ur: NEGATIVE mg/dL
Urobilinogen, UA: 0.2 mg/dL (ref 0.0–1.0)

## 2013-06-30 LAB — TYPE AND SCREEN
ABO/RH(D): A POS
Antibody Screen: NEGATIVE

## 2013-06-30 LAB — HCG, QUANTITATIVE, PREGNANCY: hCG, Beta Chain, Quant, S: <1 m[IU]/mL (ref <5)

## 2013-07-06 ENCOUNTER — Observation Stay (HOSPITAL_COMMUNITY)
Admission: RE | Admit: 2013-07-06 | Discharge: 2013-07-07 | Disposition: A | Discharge status: Home / Self Care | Payer: BC Managed Care – PPO | Source: Ambulatory Visit | Attending: Obstetrics & Gynecology | Admitting: Obstetrics & Gynecology

## 2013-07-06 ENCOUNTER — Encounter (HOSPITAL_COMMUNITY): Payer: BC Managed Care – PPO | Admitting: Anesthesiology

## 2013-07-06 ENCOUNTER — Telehealth: Payer: Self-pay | Admitting: Obstetrics & Gynecology

## 2013-07-06 ENCOUNTER — Encounter (HOSPITAL_COMMUNITY): Payer: Self-pay | Admitting: *Deleted

## 2013-07-06 ENCOUNTER — Ambulatory Visit (HOSPITAL_COMMUNITY): Payer: BC Managed Care – PPO | Admitting: Anesthesiology

## 2013-07-06 ENCOUNTER — Encounter (HOSPITAL_COMMUNITY)
Admission: RE | Disposition: A | Discharge status: Home / Self Care | Payer: Self-pay | Source: Ambulatory Visit | Attending: Obstetrics & Gynecology

## 2013-07-06 DIAGNOSIS — N920 Excessive and frequent menstruation with regular cycle: Secondary | ICD-10-CM | POA: Insufficient documentation

## 2013-07-06 DIAGNOSIS — D259 Leiomyoma of uterus, unspecified: Principal | ICD-10-CM | POA: Insufficient documentation

## 2013-07-06 DIAGNOSIS — N938 Other specified abnormal uterine and vaginal bleeding: Secondary | ICD-10-CM

## 2013-07-06 DIAGNOSIS — N801 Endometriosis of ovary: Secondary | ICD-10-CM

## 2013-07-06 DIAGNOSIS — F411 Generalized anxiety disorder: Secondary | ICD-10-CM | POA: Insufficient documentation

## 2013-07-06 DIAGNOSIS — N852 Hypertrophy of uterus: Secondary | ICD-10-CM

## 2013-07-06 DIAGNOSIS — N80109 Endometriosis of ovary, unspecified side, unspecified depth: Secondary | ICD-10-CM

## 2013-07-06 DIAGNOSIS — N946 Dysmenorrhea, unspecified: Secondary | ICD-10-CM

## 2013-07-06 DIAGNOSIS — N838 Other noninflammatory disorders of ovary, fallopian tube and broad ligament: Secondary | ICD-10-CM

## 2013-07-06 DIAGNOSIS — Z90710 Acquired absence of both cervix and uterus: Secondary | ICD-10-CM

## 2013-07-06 DIAGNOSIS — D251 Intramural leiomyoma of uterus: Secondary | ICD-10-CM

## 2013-07-06 DIAGNOSIS — N949 Unspecified condition associated with female genital organs and menstrual cycle: Secondary | ICD-10-CM

## 2013-07-06 HISTORY — PX: SUPRACERVICAL ABDOMINAL HYSTERECTOMY: SHX5393

## 2013-07-06 HISTORY — PX: SALPINGOOPHORECTOMY: SHX82

## 2013-07-06 SURGERY — HYSTERECTOMY, SUPRACERVICAL, ABDOMINAL
Anesthesia: General

## 2013-07-06 MED ORDER — KCL IN DEXTROSE-NACL 20-5-0.45 MEQ/L-%-% IV SOLN
INTRAVENOUS | Status: DC
  Administered 2013-07-06 – 2013-07-07 (×2): via INTRAVENOUS

## 2013-07-06 MED ORDER — LIDOCAINE HCL (CARDIAC) 10 MG/ML IV SOLN
INTRAVENOUS | Status: DC | PRN
  Administered 2013-07-06: 20 mg via INTRAVENOUS

## 2013-07-06 MED ORDER — ONDANSETRON 8 MG/NS 50 ML IVPB
8.0000 mg | Freq: Four times a day (QID) | INTRAVENOUS | Status: DC | PRN
  Filled 2013-07-06: qty 8

## 2013-07-06 MED ORDER — SODIUM CHLORIDE 0.9 % IR SOLN
Status: DC | PRN
  Administered 2013-07-06: 2000 mL

## 2013-07-06 MED ORDER — FENTANYL CITRATE 0.05 MG/ML IJ SOLN
INTRAMUSCULAR | Status: AC
  Filled 2013-07-06: qty 5

## 2013-07-06 MED ORDER — CEFAZOLIN SODIUM-DEXTROSE 2-3 GM-% IV SOLR
2.0000 g | INTRAVENOUS | Status: AC
  Administered 2013-07-06: 2 g via INTRAVENOUS

## 2013-07-06 MED ORDER — PROPOFOL 10 MG/ML IV BOLUS
INTRAVENOUS | Status: DC | PRN
  Administered 2013-07-06: 160 mg via INTRAVENOUS
  Administered 2013-07-06: 40 mg via INTRAVENOUS

## 2013-07-06 MED ORDER — BUPIVACAINE LIPOSOME 1.3 % IJ SUSP
INTRAMUSCULAR | Status: DC | PRN
  Administered 2013-07-06: 60 mL

## 2013-07-06 MED ORDER — PROPOFOL 10 MG/ML IV EMUL
INTRAVENOUS | Status: AC
  Filled 2013-07-06: qty 20

## 2013-07-06 MED ORDER — ONDANSETRON HCL 4 MG/2ML IJ SOLN
4.0000 mg | Freq: Once | INTRAMUSCULAR | Status: AC
  Administered 2013-07-06: 4 mg via INTRAVENOUS

## 2013-07-06 MED ORDER — FENTANYL CITRATE 0.05 MG/ML IJ SOLN
25.0000 ug | INTRAMUSCULAR | Status: DC | PRN
  Administered 2013-07-06 (×4): 50 ug via INTRAVENOUS
  Filled 2013-07-06: qty 2

## 2013-07-06 MED ORDER — NEOSTIGMINE METHYLSULFATE 1 MG/ML IJ SOLN
INTRAMUSCULAR | Status: DC | PRN
  Administered 2013-07-06: 1 mg via INTRAVENOUS
  Administered 2013-07-06: 2 mg via INTRAVENOUS

## 2013-07-06 MED ORDER — MIDAZOLAM HCL 2 MG/2ML IJ SOLN
INTRAMUSCULAR | Status: AC
  Filled 2013-07-06: qty 2

## 2013-07-06 MED ORDER — ROCURONIUM BROMIDE 100 MG/10ML IV SOLN
INTRAVENOUS | Status: DC | PRN
  Administered 2013-07-06: 10 mg via INTRAVENOUS
  Administered 2013-07-06: 45 mg via INTRAVENOUS
  Administered 2013-07-06: 5 mg via INTRAVENOUS

## 2013-07-06 MED ORDER — OXYCODONE-ACETAMINOPHEN 5-325 MG PO TABS
1.0000 | ORAL_TABLET | ORAL | Status: DC | PRN
  Administered 2013-07-06 – 2013-07-07 (×5): 2 via ORAL
  Filled 2013-07-06 (×5): qty 2

## 2013-07-06 MED ORDER — SODIUM CHLORIDE 0.9 % IJ SOLN
INTRAMUSCULAR | Status: AC
  Filled 2013-07-06: qty 40

## 2013-07-06 MED ORDER — KETOROLAC TROMETHAMINE 30 MG/ML IJ SOLN
INTRAMUSCULAR | Status: AC
  Filled 2013-07-06: qty 1

## 2013-07-06 MED ORDER — ROCURONIUM BROMIDE 50 MG/5ML IV SOLN
INTRAVENOUS | Status: AC
  Filled 2013-07-06: qty 1

## 2013-07-06 MED ORDER — BUPIVACAINE LIPOSOME 1.3 % IJ SUSP
20.0000 mL | Freq: Once | INTRAMUSCULAR | Status: DC
  Filled 2013-07-06: qty 20

## 2013-07-06 MED ORDER — HYDROMORPHONE HCL PF 1 MG/ML IJ SOLN: 1.0000 mg | INTRAMUSCULAR | Status: DC | PRN

## 2013-07-06 MED ORDER — GLYCOPYRROLATE 0.2 MG/ML IJ SOLN
INTRAMUSCULAR | Status: DC | PRN
  Administered 2013-07-06: 0.4 mg via INTRAVENOUS
  Administered 2013-07-06: 0.2 mg via INTRAVENOUS

## 2013-07-06 MED ORDER — LIDOCAINE HCL (PF) 1 % IJ SOLN
INTRAMUSCULAR | Status: AC
  Filled 2013-07-06: qty 5

## 2013-07-06 MED ORDER — GLYCOPYRROLATE 0.2 MG/ML IJ SOLN
INTRAMUSCULAR | Status: AC
  Filled 2013-07-06: qty 2

## 2013-07-06 MED ORDER — NEOSTIGMINE METHYLSULFATE 1 MG/ML IJ SOLN
INTRAMUSCULAR | Status: AC
  Filled 2013-07-06: qty 1

## 2013-07-06 MED ORDER — ONDANSETRON HCL 4 MG PO TABS
8.0000 mg | ORAL_TABLET | Freq: Four times a day (QID) | ORAL | Status: DC | PRN
  Administered 2013-07-06: 8 mg via ORAL
  Filled 2013-07-06: qty 2

## 2013-07-06 MED ORDER — KETOROLAC TROMETHAMINE 30 MG/ML IJ SOLN
30.0000 mg | Freq: Once | INTRAMUSCULAR | Status: AC
  Administered 2013-07-06: 30 mg via INTRAVENOUS

## 2013-07-06 MED ORDER — MIDAZOLAM HCL 2 MG/2ML IJ SOLN
1.0000 mg | INTRAMUSCULAR | Status: DC | PRN
  Administered 2013-07-06: 2 mg via INTRAVENOUS

## 2013-07-06 MED ORDER — MIDAZOLAM HCL 5 MG/5ML IJ SOLN
INTRAMUSCULAR | Status: DC | PRN
  Administered 2013-07-06: 2 mg via INTRAVENOUS

## 2013-07-06 MED ORDER — LACTATED RINGERS IV SOLN
INTRAVENOUS | Status: DC
  Administered 2013-07-06: 1000 mL via INTRAVENOUS
  Administered 2013-07-06 (×2): via INTRAVENOUS

## 2013-07-06 MED ORDER — CEFAZOLIN SODIUM-DEXTROSE 2-3 GM-% IV SOLR
INTRAVENOUS | Status: AC
  Filled 2013-07-06: qty 50

## 2013-07-06 MED ORDER — FENTANYL CITRATE 0.05 MG/ML IJ SOLN
INTRAMUSCULAR | Status: DC | PRN
  Administered 2013-07-06 (×2): 25 ug via INTRAVENOUS
  Administered 2013-07-06 (×3): 50 ug via INTRAVENOUS
  Administered 2013-07-06 (×5): 25 ug via INTRAVENOUS
  Administered 2013-07-06: 100 ug via INTRAVENOUS

## 2013-07-06 MED ORDER — DOCUSATE SODIUM 100 MG PO CAPS
100.0000 mg | ORAL_CAPSULE | Freq: Two times a day (BID) | ORAL | Status: DC
  Administered 2013-07-06 – 2013-07-07 (×3): 100 mg via ORAL
  Filled 2013-07-06 (×3): qty 1

## 2013-07-06 MED ORDER — ALUM & MAG HYDROXIDE-SIMETH 200-200-20 MG/5ML PO SUSP
30.0000 mL | ORAL | Status: DC | PRN
  Administered 2013-07-06 – 2013-07-07 (×2): 30 mL via ORAL
  Filled 2013-07-06 (×2): qty 30

## 2013-07-06 MED ORDER — ONDANSETRON HCL 4 MG/2ML IJ SOLN
INTRAMUSCULAR | Status: AC
  Filled 2013-07-06: qty 2

## 2013-07-06 MED ORDER — KETOROLAC TROMETHAMINE 30 MG/ML IJ SOLN
30.0000 mg | Freq: Once | INTRAMUSCULAR | Status: AC
  Administered 2013-07-06: 30 mg via INTRAVENOUS
  Filled 2013-07-06: qty 1

## 2013-07-06 MED ORDER — ONDANSETRON HCL 4 MG/2ML IJ SOLN: 4.0000 mg | Freq: Once | INTRAMUSCULAR | Status: DC | PRN

## 2013-07-06 MED ORDER — GLYCOPYRROLATE 0.2 MG/ML IJ SOLN
INTRAMUSCULAR | Status: AC
  Filled 2013-07-06: qty 1

## 2013-07-06 SURGICAL SUPPLY — 63 items
ADH SKN CLS APL DERMABOND .7 (GAUZE/BANDAGES/DRESSINGS) ×4
APPLIER CLIP 13 LRG OPEN (CLIP)
APR CLP LRG 13 20 CLIP (CLIP)
BAG HAMPER (MISCELLANEOUS) ×4 IMPLANT
BRR ADH 6X5 SEPRAFILM 1 SHT (MISCELLANEOUS) ×2
CELLS DAT CNTRL 66122 CELL SVR (MISCELLANEOUS) IMPLANT
CLIP APPLIE 13 LRG OPEN (CLIP) IMPLANT
CLOTH BEACON ORANGE TIMEOUT ST (SAFETY) ×4 IMPLANT
COVER LIGHT HANDLE STERIS (MISCELLANEOUS) ×8 IMPLANT
DERMABOND ADVANCED (GAUZE/BANDAGES/DRESSINGS) ×4
DERMABOND ADVANCED .7 DNX12 (GAUZE/BANDAGES/DRESSINGS) ×4 IMPLANT
DRAPE WARM FLUID 44X44 (DRAPE) ×4 IMPLANT
DRESSING TELFA 8X3 (GAUZE/BANDAGES/DRESSINGS) ×2 IMPLANT
ELECT REM PT RETURN 9FT ADLT (ELECTROSURGICAL) ×4
ELECTRODE REM PT RTRN 9FT ADLT (ELECTROSURGICAL) ×2 IMPLANT
FORMALIN 10 PREFIL 480ML (MISCELLANEOUS) ×4 IMPLANT
GLOVE BIOGEL PI IND STRL 7.0 (GLOVE) IMPLANT
GLOVE BIOGEL PI IND STRL 7.5 (GLOVE) IMPLANT
GLOVE BIOGEL PI IND STRL 8 (GLOVE) ×2 IMPLANT
GLOVE BIOGEL PI INDICATOR 7.0 (GLOVE) ×6
GLOVE BIOGEL PI INDICATOR 7.5 (GLOVE) ×4
GLOVE BIOGEL PI INDICATOR 8 (GLOVE) ×2
GLOVE ECLIPSE 7.0 STRL STRAW (GLOVE) ×2 IMPLANT
GLOVE ECLIPSE 8.0 STRL XLNG CF (GLOVE) ×4 IMPLANT
GLOVE SS BIOGEL STRL SZ 6.5 (GLOVE) IMPLANT
GLOVE SUPERSENSE BIOGEL SZ 6.5 (GLOVE) ×2
GOWN SRG XL XLNG 56XLVL 4 (GOWN DISPOSABLE) ×2 IMPLANT
GOWN STRL NON-REIN XL XLG LVL4 (GOWN DISPOSABLE)
GOWN STRL REUS W/TWL LRG LVL3 (GOWN DISPOSABLE) ×10 IMPLANT
GOWN STRL REUS W/TWL XL LVL3 (GOWN DISPOSABLE) ×4 IMPLANT
INST SET MAJOR GENERAL (KITS) ×4 IMPLANT
KIT ROOM TURNOVER APOR (KITS) ×4 IMPLANT
MANIFOLD NEPTUNE II (INSTRUMENTS) ×4 IMPLANT
NDL HYPO 18GX1.5 BLUNT FILL (NEEDLE) IMPLANT
NDL HYPO 25X1 1.5 SAFETY (NEEDLE) IMPLANT
NEEDLE HYPO 18GX1.5 BLUNT FILL (NEEDLE) ×8 IMPLANT
NEEDLE HYPO 25X1 1.5 SAFETY (NEEDLE) ×4 IMPLANT
NS IRRIG 1000ML POUR BTL (IV SOLUTION) ×8 IMPLANT
PACK ABDOMINAL MAJOR (CUSTOM PROCEDURE TRAY) ×4 IMPLANT
PAD ARMBOARD 7.5X6 YLW CONV (MISCELLANEOUS) ×4 IMPLANT
RETRACTOR WND ALEXIS 18 MED (MISCELLANEOUS) IMPLANT
RETRACTOR WND ALEXIS 25 LRG (MISCELLANEOUS) IMPLANT
RTRCTR WOUND ALEXIS 18CM MED (MISCELLANEOUS)
RTRCTR WOUND ALEXIS 25CM LRG (MISCELLANEOUS)
SEPRAFILM MEMBRANE 5X6 (MISCELLANEOUS) ×4 IMPLANT
SET BASIN LINEN APH (SET/KITS/TRAYS/PACK) ×4 IMPLANT
SPONGE LAP 18X18 X RAY DECT (DISPOSABLE) ×2 IMPLANT
STAPLER VISISTAT 35W (STAPLE) ×2 IMPLANT
SUT CHROMIC 0 CT 1 (SUTURE) ×4 IMPLANT
SUT MNCRL+ AB 3-0 CT1 36 (SUTURE) IMPLANT
SUT MON AB 0 CT1 (SUTURE) ×2 IMPLANT
SUT MON AB 3-0 SH 27 (SUTURE) IMPLANT
SUT MONOCRYL AB 3-0 CT1 36IN (SUTURE) ×4
SUT PLAIN 2 0 XLH (SUTURE) ×4 IMPLANT
SUT VIC AB 0 CT1 27 (SUTURE) ×16
SUT VIC AB 0 CT1 27XBRD ANTBC (SUTURE) ×2 IMPLANT
SUT VIC AB 0 CT1 27XCR 8 STRN (SUTURE) ×4 IMPLANT
SUT VIC AB 0 CTX 36 (SUTURE) ×4
SUT VIC AB 0 CTX36XBRD ANTBCTR (SUTURE) ×2 IMPLANT
SUT VICRYL 3 0 (SUTURE) ×2 IMPLANT
SYR 20CC LL (SYRINGE) ×4 IMPLANT
TOWEL BLUE STERILE X RAY DET (MISCELLANEOUS) ×2 IMPLANT
TRAY FOLEY CATH 16FR SILVER (SET/KITS/TRAYS/PACK) ×4 IMPLANT

## 2013-07-06 NOTE — Op Note (Signed)
Preoperative diagnosis:  1.  16 week fibroid uterus                                          2.  menorrhagia                                         3.  dysmenorrhea                                         4.  Pelvic pain  Postoperative diagnosis:  Same as above   Procedure:  Abdominal hysterectomy, supracervical with prophylactic removal of both tubes and ovaries  Surgeon:  Florian Buff  Assistant:    Anesthesia:  General endotracheal  Preoperative clinical summary:  Patient is status post ablation several years ago and had been doing well.  Presented to the office with heavy painful bleeding and pelvic pressure and pain.  Sonogram in office revealed an enlarged fibroid uterus, exam 16 week size  Intraoperative findings: enlarged fibroid uterus, normal tubes and ovaries  Description of operation:  Patient was taken to the operating room and placed in the supine position where she underwent general endotracheal anesthesia.  She was then prepped and draped in the usual sterile fashion and a Foley catheter was placed for continuous bladder drainage.  A Pfannenstiel skin incision was made and carried down sharply to the rectus fascia which was scored in the midline and extended laterally.  The fascia was taken off the muscles superiorly and inferiorly without difficulty.  The muscles were divided.  The peritoneal cavity was entered.    The upper abdomen was packed away. Both uterine cornu were grasped with Coker clamps.  The left round ligament was suture ligated and coagulated with the electrocautery unit.  The left vesicouterine serosal flap was created.  An avascular window in in the peritoneum was created and the utero-ovarian ligament was cross clamped, cut and suture ligated.  The right round ligament was suture ligated and cut with the electrocautery unit.  The vesicouterine serosal flap on the right was created.  An avascular window in the peritoneum was created and the right utero-ovarian  ligament was cross clamped, cut and double suture ligated.  The uterine vessels were skeletonized bilaterally.  The uterine vessels were clamped bilaterally,  then cut and suture ligated.  Two more pedicles were taken down the cervix medial to the uterine vessels.  Each pedicle was clamped cut and suture ligated with good resulting hemostasis.  As per the preoperative plan the cervix was then transected sharply and the specimen was removed.  The cervical stump was then closed anterior to posterior for hemostasis and reduce postoperative adhesions.  The pelvis was irrigated vigorously and all pedicles were examined and found to be hemostatic.  The infundibulo pelvic ligaments on both sides were then cross clamped and double suture ligated, with good resulting hemostasis bilaterally.    All specimens were sent to pathology for routine evaluation. The pelvic peritoneum was closed anterior to posterior for adhesion reduction and hemostasis.  All packs were removed and all counts were correct at this point x 3.  The muscles and peritoneum were reapproximated loosely.  The fascia  was closed with 0 Vicryl running.  The subcutaneous tissue was reapproximated using 2-0 plain gut.  The skin was closed using 3-0 Vicryl on a Keith needle in a subcuticular fashion.  Dermabond was then applied for additional wound integrity and to serve as a postoperative bacterial barrier.  The patient was awakened from anesthesia taken to the recovery room in good stable condition. All sponge instrument and needle counts were correct x 3.  The patient received Ancef and Toradol prophylactically preoperatively.  Estimated blood loss for the procedure was 200  cc.  Kaela Beitz H 07/06/2013 10:57 AM

## 2013-07-06 NOTE — Telephone Encounter (Signed)
Talked with pt, she has pre op today and will check her BP

## 2013-07-06 NOTE — Transfer of Care (Signed)
Immediate Anesthesia Transfer of Care Note  Patient: Nancy [name]  Procedure(s) Performed: Procedure(s) (LRB): HYSTERECTOMY SUPRACERVICAL ABDOMINAL (N/A) SALPINGO OOPHORECTOMY (Bilateral)  Patient Location: PACU  Anesthesia Type: General  Level of Consciousness: awake  Airway & Oxygen Therapy: Patient Spontanous Breathing and non-rebreather face mask  Post-op Assessment: Report given to PACU RN, Post -op Vital signs reviewed and stable and Patient moving all extremities  Post vital signs: Reviewed and stable  Complications: No apparent anesthesia complications

## 2013-07-06 NOTE — Anesthesia Procedure Notes (Signed)
Procedure Name: Intubation Date/Time: 07/06/2013 8:43 AM Performed by: Vista Deck Pre-anesthesia Checklist: Patient identified, Patient being monitored, Timeout performed, Emergency Drugs available and Suction available Patient Re-evaluated:Patient Re-evaluated prior to inductionOxygen Delivery Method: Circle System Utilized Preoxygenation: Pre-oxygenation with 100% oxygen Intubation Type: IV induction Ventilation: Mask ventilation without difficulty Laryngoscope Size: Miller and 2 Grade View: Grade I Tube type: Oral Tube size: 7.0 mm Number of attempts: 1 Airway Equipment and Method: stylet and Oral airway Placement Confirmation: ETT inserted through vocal cords under direct vision,  positive ETCO2 and breath sounds checked- equal and bilateral Secured at: 21 cm Tube secured with: Tape Dental Injury: Teeth and Oropharynx as per pre-operative assessment

## 2013-07-06 NOTE — Anesthesia Postprocedure Evaluation (Signed)
Anesthesia Post Note  Patient: Nancy [name]  Procedure(s) Performed: Procedure(s) (LRB): HYSTERECTOMY SUPRACERVICAL ABDOMINAL (N/A) SALPINGO OOPHORECTOMY (Bilateral)  Anesthesia type: General  Patient location: PACU  Post pain: Pain level controlled  Post assessment: Post-op Vital signs reviewed, Patient's Cardiovascular Status Stable, Respiratory Function Stable, Patent Airway, No signs of Nausea or vomiting and Pain level controlled  Last Vitals:  Filed Vitals:   07/06/13 1052  BP: 138/84  Pulse: 79  Temp: 36.4 C  Resp: 20    Post vital signs: Reviewed and stable  Level of consciousness: awake and alert   Complications: No apparent anesthesia complications

## 2013-07-06 NOTE — Anesthesia Preprocedure Evaluation (Signed)
Anesthesia Evaluation  Patient identified by MRN, date of birth, ID band Patient awake    Reviewed: Allergy & Precautions, H&P , NPO status , Patient's Chart, lab work & pertinent test results  History of Anesthesia Complications Negative for: history of anesthetic complications  Airway Mallampati: II TM Distance: >3 FB Neck ROM: Full    Dental  (+) Teeth Intact   Pulmonary neg pulmonary ROS,  breath sounds clear to auscultation        Cardiovascular negative cardio ROS  Rhythm:Regular Rate:Normal     Neuro/Psych PSYCHIATRIC DISORDERS Anxiety    GI/Hepatic negative GI ROS,   Endo/Other    Renal/GU      Musculoskeletal   Abdominal   Peds  Hematology   Anesthesia Other Findings   Reproductive/Obstetrics                           Anesthesia Physical Anesthesia Plan  ASA: II  Anesthesia Plan: General   Post-op Pain Management:    Induction: Intravenous  Airway Management Planned: Oral ETT  Additional Equipment:   Intra-op Plan:   Post-operative Plan: Extubation in OR  Informed Consent: I have reviewed the patients History and Physical, chart, labs and discussed the procedure including the risks, benefits and alternatives for the proposed anesthesia with the patient or authorized representative who has indicated his/her understanding and acceptance.     Plan Discussed with:   Anesthesia Plan Comments:         Anesthesia Quick Evaluation

## 2013-07-06 NOTE — H&P (Addendum)
Preoperative History and Physical  Nancy [name] is a 48 y.o. [id number] with Patient's last menstrual period was 07/06/2013. admitted for a supracervical hysterectomy and removal of Fallopian tubes prophylactically and ovaries.(pt understands there will be a transition in the post6 operative period without ovarian estrogenic production)  Pt has had increasing problems with pelvic pain and bleeding with dysmenorrhea.  She is status post ablation 7 or 8 years ago but has subsequently developed enlarged fibroid uterus, about 16 weeks size.  PMH:    Past Medical History  Diagnosis Date  . Anxiety   . Irregular bleeding 03/10/2013    PSH:     Past Surgical History  Procedure Laterality Date  . Tubal ligation    . Dilation and curettage of uterus    . Endometrial ablation      POb/GynH:      OB History   Grav Para Term Preterm Abortions TAB SAB Ect Mult Living   4 2   2  2   2       SH:   History  Substance Use Topics  . Smoking status: Never Smoker   . Smokeless tobacco: Never Used  . Alcohol Use: No    FH:    Family History  Problem Relation Age of Onset  . Heart disease Mother   . COPD Mother   . Heart attack Father   . Cancer Father     lung and renal  . Hypertension Maternal Grandmother   . Heart disease Maternal Grandmother   . Hypertension Maternal Grandfather   . Heart disease Maternal Grandfather   . Hypertension Paternal Grandmother   . Heart disease Paternal Grandmother   . Hypertension Paternal Grandfather   . Heart disease Paternal Grandfather   . Cancer Sister     lung,renal    Scheduled Meds: . bupivacaine liposome  20 mL Infiltration Once  .  ceFAZolin (ANCEF) IV  2 g Intravenous On Call to OR   Continuous Infusions: . lactated ringers 75 mL/hr at 07/06/13 0758   PRN Meds:.midazolam  Allergies: No Known Allergies  Medications:      Current facility-administered medications:bupivacaine liposome (EXPAREL) 1.3 % injection 266 mg, 20 mL,  Infiltration, Once, Florian Buff, MD;  ceFAZolin (ANCEF) IVPB 2 g/50 mL premix, 2 g, Intravenous, On Call to OR, Florian Buff, MD;  lactated ringers infusion, , Intravenous, Continuous, Lerry Liner, MD, Last Rate: 75 mL/hr at 07/06/13 0758 midazolam (VERSED) injection 1-2 mg, 1-2 mg, Intravenous, Q5 Min x 3 PRN, Lerry Liner, MD, 2 mg at 07/06/13 0815  Review of Systems:   Review of Systems  Constitutional: Negative for fever, chills, weight loss, malaise/fatigue and diaphoresis.  HENT: Negative for hearing loss, ear pain, nosebleeds, congestion, sore throat, neck pain, tinnitus and ear discharge.   Eyes: Negative for blurred vision, double vision, photophobia, pain, discharge and redness.  Respiratory: Negative for cough, hemoptysis, sputum production, shortness of breath, wheezing and stridor.   Cardiovascular: Negative for chest pain, palpitations, orthopnea, claudication, leg swelling and PND.  Gastrointestinal: Positive for abdominal pain. Negative for heartburn, nausea, vomiting, diarrhea, constipation, blood in stool and melena.  Genitourinary: Negative for dysuria, urgency, frequency, hematuria and flank pain.  Musculoskeletal: Negative for myalgias, back pain, joint pain and falls.  Skin: Negative for itching and rash.  Neurological: Negative for dizziness, tingling, tremors, sensory change, speech change, focal weakness, seizures, loss of consciousness, weakness and headaches.  Endo/Heme/Allergies: Negative for environmental allergies and polydipsia. Does not bruise/bleed easily.  Psychiatric/Behavioral: Negative for depression, suicidal ideas, hallucinations, memory loss and substance abuse. The patient is not nervous/anxious and does not have insomnia.      PHYSICAL EXAM:  Blood pressure 132/94, pulse 77, temperature 98.6 F (37 C), temperature source Oral, resp. rate 18, last menstrual period 07/06/2013, SpO2 100.00%.    Vitals reviewed. Constitutional: She is oriented  to person, place, and time. She appears well-developed and well-nourished.  HENT:  Head: Normocephalic and atraumatic.  Right Ear: External ear normal.  Left Ear: External ear normal.  Nose: Nose normal.  Mouth/Throat: Oropharynx is clear and moist.  Eyes: Conjunctivae and EOM are normal. Pupils are equal, round, and reactive to light. Right eye exhibits no discharge. Left eye exhibits no discharge. No scleral icterus.  Neck: Normal range of motion. Neck supple. No tracheal deviation present. No thyromegaly present.  Cardiovascular: Normal rate, regular rhythm, normal heart sounds and intact distal pulses.  Exam reveals no gallop and no friction rub.   No murmur heard. Respiratory: Effort normal and breath sounds normal. No respiratory distress. She has no wheezes. She has no rales. She exhibits no tenderness.  GI: Soft. Bowel sounds are normal. She exhibits no distension and no mass. There is tenderness. There is no rebound and no guarding.  Genitourinary:       Vulva is normal without lesions Vagina is pink moist without discharge Cervix normal in appearance and pap is normal Uterus is enlarged to 14-16 weeks size due to multiple fibroids  Adnexa is negative with normal sized ovaries by sonogram  Musculoskeletal: Normal range of motion. She exhibits no edema and no tenderness.  Neurological: She is alert and oriented to person, place, and time. She has normal reflexes. She displays normal reflexes. No cranial nerve deficit. She exhibits normal muscle tone. Coordination normal.  Skin: Skin is warm and dry. No rash noted. No erythema. No pallor.  Psychiatric: She has a normal mood and affect. Her behavior is normal. Judgment and thought content normal.    Labs: Results for orders placed during the hospital encounter of 06/30/13 (from the past 336 hour(s))  HCG, QUANTITATIVE, PREGNANCY   Collection Time    06/30/13  1:10 PM      Result Value Ref Range   hCG, Beta Chain, Quant, S <1  <5  mIU/mL  CBC   Collection Time    06/30/13  3:10 PM      Result Value Ref Range   WBC 7.3  4.0 - 10.5 K/uL   RBC 4.50  3.87 - 5.11 MIL/uL   Hemoglobin 13.9  12.0 - 15.0 g/dL   HCT 39.0  36.0 - 46.0 %   MCV 86.7  78.0 - 100.0 fL   MCH 30.9  26.0 - 34.0 pg   MCHC 35.6  30.0 - 36.0 g/dL   RDW 12.6  11.5 - 15.5 %   Platelets 243  150 - 400 K/uL  COMPREHENSIVE METABOLIC PANEL   Collection Time    06/30/13  3:10 PM      Result Value Ref Range   Sodium 144  137 - 147 mEq/L   Potassium 4.5  3.7 - 5.3 mEq/L   Chloride 107  96 - 112 mEq/L   CO2 26  19 - 32 mEq/L   Glucose, Bld 104 (*) 70 - 99 mg/dL   BUN 9  6 - 23 mg/dL   Creatinine, Ser 0.77  0.50 - 1.10 mg/dL   Calcium 9.9  8.4 - 10.5 mg/dL  Total Protein 7.1  6.0 - 8.3 g/dL   Albumin 4.1  3.5 - 5.2 g/dL   AST 24  0 - 37 U/L   ALT 42 (*) 0 - 35 U/L   Alkaline Phosphatase 46  39 - 117 U/L   Total Bilirubin 0.3  0.3 - 1.2 mg/dL   GFR calc non Af Amer >90  >90 mL/min   GFR calc Af Amer >90  >90 mL/min  TYPE AND SCREEN   Collection Time    06/30/13  3:10 PM      Result Value Ref Range   ABO/RH(D) A POS     Antibody Screen NEG     Sample Expiration 07/14/2013    URINALYSIS, ROUTINE W REFLEX MICROSCOPIC   Collection Time    06/30/13  3:42 PM      Result Value Ref Range   Color, Urine YELLOW  YELLOW   APPearance CLEAR  CLEAR   Specific Gravity, Urine <1.005 (*) 1.005 - 1.030   pH 5.5  5.0 - 8.0   Glucose, UA NEGATIVE  NEGATIVE mg/dL   Hgb urine dipstick LARGE (*) NEGATIVE   Bilirubin Urine NEGATIVE  NEGATIVE   Ketones, ur NEGATIVE  NEGATIVE mg/dL   Protein, ur NEGATIVE  NEGATIVE mg/dL   Urobilinogen, UA 0.2  0.0 - 1.0 mg/dL   Nitrite NEGATIVE  NEGATIVE   Leukocytes, UA NEGATIVE  NEGATIVE  URINE MICROSCOPIC-ADD ON   Collection Time    06/30/13  3:42 PM      Result Value Ref Range   Squamous Epithelial / LPF RARE  RARE   WBC, UA 0-2  <3 WBC/hpf   RBC / HPF 3-6  <3 RBC/hpf   Bacteria, UA RARE  RARE    EKG: No orders  found for this or any previous visit.  Imaging Studies: US Transvaginal Non-ob  2013/07/20   GYNECOLOGIC SONOGRAM   Nancy [name] is a 48 y.o. [id number] LMP 06/17/2013 for a pelvic sonogram  for DUB and is presently taking Megace. Pt with H/O Endometrial Ablation,  BTL and D&C.  Uterus                      15.1 x 10.9 x 10.6 cm, retroflexed uterus with  multiple fibroids noted (6 in # and largest 2 = 48 & 49mm)  Endometrium          9.6 mm, distorted by multiple fibroids  Right ovary             2.9 x 2.0 x 1.6 cm,   Left ovary                2.6 x 2.0 x 1.8 cm,   No free fluid or adnexal masses noted within pelvis  Technician Comments:  Retroflexed uterus noted with multiple fibroids, Endometrial cavity  distorted by fibroids, bilateral adnexa/ovaries appears wnl, no free fluid  or adnexal masses noted within pelvis   Lazarus Gowda July 20, 2013 10:04 AM  Clinical Impression and recommendations:  I have reviewed the sonogram results above, combined with the patient's  current clinical course, below are my impressions and any appropriate  recommendations for management based on the sonographic findings.  Multiple fibroids with submucosal location as well, which is the reason  for her bleeding No other anormalities  EURE,LUTHER H 20-Jul-2013 10:29 AM         Assessment: Patient Active Problem List   Diagnosis Date Noted  . Fibroids 07-20-13  . Excessive or  frequent menstruation 06/27/2013  . Dysmenorrhea 06/27/2013  . Anxiety 03/10/2013  . Irregular bleeding 03/10/2013    Plan: Abdominal supracervical hysterectomy with removal of Fallopian tubes and ovaries  Pt understands the risks of surgery including but not limited t  excessive bleeding requiring transfusion or reoperation, post-operative infection requiring prolonged hospitalization or re-hospitalization and antibiotic therapy, and damage to other organs including bladder, bowel, ureters and major vessels.  The patient also understands the  alternative treatment options which were discussed in full.  All questions were answered.  EURE,LUTHER H 07/06/2013 8:27 AM   EURE,LUTHER H 07/06/2013 8:23 AM

## 2013-07-06 NOTE — Telephone Encounter (Signed)
Go ahead and fill out her forms as pt requests.  OK to return to work on 08/15/2013  thanks

## 2013-07-07 ENCOUNTER — Encounter (HOSPITAL_COMMUNITY): Payer: Self-pay | Admitting: Obstetrics & Gynecology

## 2013-07-07 LAB — CBC
HCT: 33.7 % — ABNORMAL LOW (ref 36.0–46.0)
HEMOGLOBIN: 11.8 g/dL — AB (ref 12.0–15.0)
MCH: 30.6 pg (ref 26.0–34.0)
MCHC: 35 g/dL (ref 30.0–36.0)
MCV: 87.5 fL (ref 78.0–100.0)
PLATELETS: 221 10*3/uL (ref 150–400)
RBC: 3.85 MIL/uL — AB (ref 3.87–5.11)
RDW: 13 % (ref 11.5–15.5)
WBC: 9.5 10*3/uL (ref 4.0–10.5)

## 2013-07-07 LAB — BASIC METABOLIC PANEL
BUN: 5 mg/dL — ABNORMAL LOW (ref 6–23)
CALCIUM: 8.5 mg/dL (ref 8.4–10.5)
CO2: 25 mEq/L (ref 19–32)
CREATININE: 0.7 mg/dL (ref 0.50–1.10)
Chloride: 107 mEq/L (ref 96–112)
GFR calc Af Amer: >90 mL/min (ref >90)
GFR calc non Af Amer: >90 mL/min (ref >90)
GLUCOSE: 122 mg/dL — AB (ref 70–99)
Potassium: 3.9 mEq/L (ref 3.7–5.3)
Sodium: 141 mEq/L (ref 137–147)

## 2013-07-07 MED ORDER — KETOROLAC TROMETHAMINE 10 MG PO TABS: 10.0000 mg | ORAL_TABLET | Freq: Three times a day (TID) | ORAL | Status: DC | PRN

## 2013-07-07 MED ORDER — OXYCODONE-ACETAMINOPHEN 5-325 MG PO TABS: 1.0000 | ORAL_TABLET | ORAL | Status: DC | PRN

## 2013-07-07 NOTE — Discharge Summary (Signed)
Physician Discharge Summary  Patient ID: Nancy [name] MRN: [id number] DOB/AGE: 11/28/1965 48 y.o.  Admit date: 07/06/2013 Discharge date: 07/07/2013  Admission Diagnoses: fibroids  Discharge Diagnoses:  Active Problems:   S/P abdominal hysterectomy removal of tubes and ovaries Discharged Condition: good  Hospital Course: unremarkable  Consults: None  Significant Diagnostic Studies: labs:   Treatments: surgery: as above  Discharge Exam: Blood pressure 124/68, pulse 75, temperature 98.2 F (36.8 C), temperature source Oral, resp. rate 18, height 5\' 7"  (1.702 m), weight 234 lb (106.142 kg), last menstrual period 07/06/2013, SpO2 100.00%. General appearance: alert, cooperative and no distress GI: soft, non-tender; bowel sounds normal; no masses,  no organomegaly Incision/Wound:clean dry intact  Disposition:   Discharge Orders   Future Appointments Provider Department Dept Phone   07/13/2013 1:30 PM Florian Buff, MD [facility] [phone]   Future Orders Complete By Expires   Call MD for:  persistant nausea and vomiting  As directed    Call MD for:  severe uncontrolled pain  As directed    Call MD for:  temperature >100.4  As directed    Diet - low sodium heart healthy  As directed    Discharge wound care:  As directed    Comments:     Keep clean dry   Driving Restrictions  As directed    Comments:     None for 1 week   Increase activity slowly  As directed    Lifting restrictions  As directed    Comments:     No more than 8 pounds   Sexual Activity Restrictions  As directed    Comments:     None for 6 weeks       Medication List    STOP taking these medications       megestrol 40 MG tablet  Commonly known as:  MEGACE      TAKE these medications       ketorolac 10 MG tablet  Commonly known as:  TORADOL  Take 1 tablet (10 mg total) by mouth every 8 (eight) hours as needed.     oxyCODONE-acetaminophen 5-325 MG per tablet  Commonly known as:   PERCOCET/ROXICET  Take 1-2 tablets by mouth every 4 (four) hours as needed for severe pain (moderate to severe pain (when tolerating fluids)).           Follow-up Information   Follow up with Florian Buff, MD In 1 week.   Specialties:  Obstetrics and Gynecology, Radiology   Contact information:   [place] [address] [phone]       Signed: Florian Buff 07/07/2013, 10:28 AM

## 2013-07-07 NOTE — Discharge Planning (Signed)
Pt stated that she was ready to go home and pain was under control.  IV removed.  Pt given DC paper and educated about further s/sx of infection and when needed to call doctor or return to hospital.  Pt told of FU appointments and also given scripts.  Pt will be wheeled to car by PCT and family when ready.

## 2013-07-07 NOTE — Addendum Note (Signed)
Addendum created 07/07/13 1133 by Charmaine Downs, CRNA   Modules edited: Notes Section   Notes Section:  File: [id number]

## 2013-07-07 NOTE — Telephone Encounter (Signed)
Note for pt to return to work on 08/15/2013 faxed to [employer]ec[employer]ute at [phone] per pt request.

## 2013-07-07 NOTE — Discharge Instructions (Signed)
Hysterectomy °Care After °Refer to this sheet in the next few weeks. These instructions provide you with information on caring for yourself after your procedure. Your caregiver may also give you more specific instructions. Your treatment has been planned according to current medical practices, but problems sometimes occur. Call your caregiver if you have any problems or questions after your procedure. °HOME CARE INSTRUCTIONS  °Healing will take time. You may have discomfort, tenderness, swelling, and bruising at the surgical site for about 2 weeks. This is normal and will get better as time goes on. °· Only take over-the-counter or prescription medicines for pain, discomfort, or fever as directed by your caregiver. °· Do not take aspirin. It can cause bleeding. °· Do not drive when taking pain medicine. °· Follow your caregiver's advice regarding exercise, lifting, driving, and general activities. °· Resume your usual diet as directed and allowed. °· Get plenty of rest and sleep. °· Do not douche, use tampons, or have sexual intercourse for at least 6 weeks or until your caregiver gives you permission. °· Change your bandages (dressings) as directed by your caregiver. °· Monitor your temperature. °· Take showers instead of baths for 2 to 3 weeks. °· Do not drink alcohol until your caregiver gives you permission. °· If you are constipated, you may take a mild laxative with your caregiver's permission. Bran foods may help with constipation problems. Drinking enough fluids to keep your urine clear or pale yellow may help as well. °· Try to have someone home with you for 1 or 2 weeks to help around the house. °· Keep all of your follow-up appointments as directed by your caregiver. °SEEK MEDICAL CARE IF:  °· You have swelling, redness, or increasing pain in the surgical cut (incision) area. °· You have pus coming from the incision. °· You notice a bad smell coming from the incision or dressing. °· You have swelling,  redness, or pain around the intravenous (IV) site. °· Your incision breaks open. °· You feel dizzy or lightheaded. °· You have pain or bleeding when you urinate. °· You have persistent diarrhea. °· You have persistent nausea and vomiting. °· You have abnormal vaginal discharge. °· You have a rash. °· You have any type of abnormal reaction or develop an allergy to your medicine. °· Your pain is not controlled with your prescribed medicine. °SEEK IMMEDIATE MEDICAL CARE IF:  °· You have a fever. °· You have severe abdominal pain. °· You have chest pain. °· You have shortness of breath. °· You faint. °· You have pain, swelling, or redness of your leg. °· You have heavy vaginal bleeding with blood clots. °MAKE SURE YOU: °· Understand these instructions. °· Will watch your condition. °· Will get help right away if you are not doing well or get worse. °Document Released: 11/01/2004 Document Revised: 07/07/2011 Document Reviewed: 11/29/2010 °Exit[employer]® Patient Information ©2014 [employer]. ° °

## 2013-07-07 NOTE — Anesthesia Postprocedure Evaluation (Signed)
  Anesthesia Post-op Note  Patient: Nancy [name]  Procedure(s) Performed: Procedure(s): HYSTERECTOMY SUPRACERVICAL ABDOMINAL (N/A) SALPINGO OOPHORECTOMY (Bilateral)  Patient Location: Room 333  Anesthesia Type:General  Level of Consciousness: awake, alert , oriented and patient cooperative  Airway and Oxygen Therapy: Patient Spontanous Breathing  Post-op Pain: 3 /10, mild  Post-op Assessment: Post-op Vital signs reviewed, Patient's Cardiovascular Status Stable, Respiratory Function Stable, Patent Airway, No signs of Nausea or vomiting, Adequate PO intake and Pain level controlled  Post-op Vital Signs: Reviewed and stable  Complications: No apparent anesthesia complications

## 2013-07-13 ENCOUNTER — Ambulatory Visit (INDEPENDENT_AMBULATORY_CARE_PROVIDER_SITE_OTHER): Payer: Self-pay | Admitting: Obstetrics & Gynecology

## 2013-07-13 ENCOUNTER — Encounter: Payer: Self-pay | Admitting: Obstetrics & Gynecology

## 2013-07-13 VITALS — BP 130/80 | Wt 228.0 lb

## 2013-07-13 DIAGNOSIS — Z9889 Other specified postprocedural states: Secondary | ICD-10-CM

## 2013-07-13 DIAGNOSIS — Z90710 Acquired absence of both cervix and uterus: Secondary | ICD-10-CM

## 2013-07-13 NOTE — Progress Notes (Signed)
Patient ID: Nancy [name], female   DOB: 11-07-1965, 48 y.o.   MRN: [id number] Pt is POD#7 from a supracervical hysterectomy and removal of both tubes and ovaries Bowels working fine Urinating without problems  Incision clean dry intact dermabond in place  Follow up 4 weeks

## 2013-08-10 ENCOUNTER — Ambulatory Visit (INDEPENDENT_AMBULATORY_CARE_PROVIDER_SITE_OTHER): Payer: Self-pay | Admitting: Obstetrics & Gynecology

## 2013-08-10 ENCOUNTER — Encounter: Payer: Self-pay | Admitting: Obstetrics & Gynecology

## 2013-08-10 VITALS — BP 150/110 | Wt 226.0 lb

## 2013-08-10 DIAGNOSIS — Z9889 Other specified postprocedural states: Secondary | ICD-10-CM

## 2013-08-10 MED ORDER — ESTRADIOL 1 MG/GM TD GEL: 1.0000 | Freq: Every day | TRANSDERMAL | Status: DC

## 2013-08-10 NOTE — Progress Notes (Signed)
Patient ID: Nancy [name], female   DOB: [birth year], 48 y.o.   MRN: [id number] 5 weeks post op from supracervical hysterectomy and removal of tubes and ovaries Pt doing well No problems  Incision clean dry intact  divigel 1 mg daily is working well  Exam  Normal  Ok for sex  Follow up 1 year  Past Medical History  Diagnosis Date  . Anxiety   . Irregular bleeding 03/10/2013    Past Surgical History  Procedure Laterality Date  . Tubal ligation    . Dilation and curettage of uterus    . Endometrial ablation    . Supracervical abdominal hysterectomy N/A 07/06/2013    Procedure: HYSTERECTOMY SUPRACERVICAL ABDOMINAL;  Surgeon: Florian Buff, MD;  Location: [facility];  Service: Gynecology;  Laterality: N/A;  . Salpingoophorectomy Bilateral 07/06/2013    Procedure: SALPINGO OOPHORECTOMY;  Surgeon: Florian Buff, MD;  Location: [facility];  Service: Gynecology;  Laterality: Bilateral;  . Abdominal hysterectomy      OB History   Grav Para Term Preterm Abortions TAB SAB Ect Mult Living   4 2   2  2   2       No Known Allergies  History   Social History  . Marital Status: Divorced    Spouse Name: N/A    Number of Children: N/A  . Years of Education: N/A   Social History Main Topics  . Smoking status: Never Smoker   . Smokeless tobacco: Never Used  . Alcohol Use: No  . Drug Use: No  . Sexual Activity: Not Currently    Birth Control/ Protection: Surgical   Other Topics Concern  . None   Social History Narrative  . None    Family History  Problem Relation Age of Onset  . Heart disease Mother   . COPD Mother   . Heart attack Father   . Cancer Father     lung and renal  . Hypertension Maternal Grandmother   . Heart disease Maternal Grandmother   . Hypertension Maternal Grandfather   . Heart disease Maternal Grandfather   . Hypertension Paternal Grandmother   . Heart disease Paternal Grandmother   . Hypertension Paternal Grandfather   . Heart disease Paternal  Grandfather   . Cancer Sister     lung,renal

## 2013-08-22 ENCOUNTER — Other Ambulatory Visit: Payer: Self-pay | Admitting: *Deleted

## 2013-08-23 MED ORDER — KETOROLAC TROMETHAMINE 10 MG PO TABS: 10.0000 mg | ORAL_TABLET | Freq: Three times a day (TID) | ORAL | Status: DC | PRN

## 2014-02-27 ENCOUNTER — Encounter: Payer: Self-pay | Admitting: Obstetrics & Gynecology

## 2016-01-23 ENCOUNTER — Encounter: Payer: Self-pay | Admitting: Family Medicine

## 2016-01-23 ENCOUNTER — Ambulatory Visit (INDEPENDENT_AMBULATORY_CARE_PROVIDER_SITE_OTHER): Payer: BC Managed Care – PPO | Admitting: Family Medicine

## 2016-01-23 VITALS — BP 130/78 | HR 86 | Resp 18 | Ht 67.0 in | Wt 221.0 lb

## 2016-01-23 DIAGNOSIS — N6325 Unspecified lump in the left breast, overlapping quadrants: Secondary | ICD-10-CM

## 2016-01-23 DIAGNOSIS — Z23 Encounter for immunization: Secondary | ICD-10-CM | POA: Diagnosis not present

## 2016-01-23 DIAGNOSIS — Z90710 Acquired absence of both cervix and uterus: Secondary | ICD-10-CM

## 2016-01-23 DIAGNOSIS — N63 Unspecified lump in breast: Secondary | ICD-10-CM | POA: Diagnosis not present

## 2016-01-23 DIAGNOSIS — Z7189 Other specified counseling: Secondary | ICD-10-CM | POA: Diagnosis not present

## 2016-01-23 DIAGNOSIS — F419 Anxiety disorder, unspecified: Secondary | ICD-10-CM

## 2016-01-23 DIAGNOSIS — N6320 Unspecified lump in the left breast, unspecified quadrant: Secondary | ICD-10-CM

## 2016-01-23 DIAGNOSIS — Z7689 Persons encountering health services in other specified circumstances: Secondary | ICD-10-CM

## 2016-01-23 MED ORDER — FLUOXETINE HCL 20 MG PO TABS: 20.0000 mg | ORAL_TABLET | Freq: Every day | ORAL | 3 refills | Status: DC

## 2016-01-23 NOTE — Patient Instructions (Signed)
Mammogram ordered I will notify you of the result  Start fluoxetine every day Call for problems  See me in one month

## 2016-01-23 NOTE — Progress Notes (Signed)
Chief Complaint  Patient presents with  . 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 She is in good health and on no medications. She is behind with her health maintenance, her last physical examination and mammogram were in 2013 She is agreeable to a flu shot today. She has 2 issues she wishes to discuss. 1. Irregularity in left breast noted within the last week or 2. She states when she looks in the mirror she can see "creases" in the upper breast. She doesn't feel a discrete lump. She states it feels "thick". No breast cancer in family. She had her first child at the age of 34. No prior breast problems. Last mammogram in 2013 was  Normal She states that she eats well. She walks her dog daily but does not walk assertively to break a sweat. We discussed a heart healthy diet and regular exercise recommendations. 2. Anxiety. The patient's been treated for in.anxiety and depression in the past. She was on Celexa. She stopped Celexa because it caused her to gain weight. She would like to be on Celexa or a similar medication because she is feeling stressed now. She notices she feels slightly irritable and tearful. No suicidal thoughts. No thoughts of harming others. No somatic complaints weight loss or weight gain.   Patient Active Problem List   Diagnosis Date Noted  . H/O: hysterectomy 01/23/2016  . Anxiety 03/10/2013    Outpatient Encounter Prescriptions as of 01/23/2016  Medication Sig  . FLUoxetine (PROZAC) 20 MG tablet Take 1 tablet (20 mg total) by mouth daily.  . [DISCONTINUED] citalopram (CELEXA) 40 MG tablet Take 40 mg by mouth daily.   No facility-administered encounter medications on file as of 01/23/2016.     Past Medical History:  Diagnosis Date  . Anxiety   . Depression   . GERD (gastroesophageal reflux disease)   . Irregular bleeding 03/10/2013    Past Surgical History:  Procedure  Laterality Date  . ABDOMINAL HYSTERECTOMY     heavy irreg bleeding  . CHOLECYSTECTOMY    . DILATION AND CURETTAGE OF UTERUS    . ENDOMETRIAL ABLATION    . SALPINGOOPHORECTOMY Bilateral 07/06/2013   Procedure: SALPINGO OOPHORECTOMY;  Surgeon: Florian Buff, MD;  Location: [facility];  Service: Gynecology;  Laterality: Bilateral;  . SUPRACERVICAL ABDOMINAL HYSTERECTOMY N/A 07/06/2013   Procedure: HYSTERECTOMY SUPRACERVICAL ABDOMINAL;  Surgeon: Florian Buff, MD;  Location: [facility];  Service: Gynecology;  Laterality: N/A;  . TUBAL LIGATION      Social History   Social History  . Marital status: Divorced    Spouse name: N/A  . Number of children: 2  . Years of education: 16   Occupational History  . teacher [employer]    4th grade   Social History Main Topics  . Smoking status: Never Smoker  . Smokeless tobacco: Never Used  . Alcohol use No  . Drug use: No  . Sexual activity: Yes    Birth control/ protection: Surgical   Other Topics Concern  . Not on file   Social History Narrative   Lives at home with fiancee   Daughter 29 has 4 children   Daughter 41 lives in [place]    Family History  Problem Relation Age of Onset  . COPD Mother   . Heart disease Mother     CHF  . Cancer Father     kidney met to  lung  . Heart attack Father 73    died of heart attack  . Hypertension Maternal Grandmother   . Heart disease Maternal Grandmother   . Hypertension Maternal Grandfather   . Heart disease Maternal Grandfather   . Hypertension Paternal Grandmother   . Heart disease Paternal Grandmother   . Hypertension Paternal Grandfather   . Heart disease Paternal Grandfather   . Cancer Sister 64    lung,renal  . Alcohol abuse Sister     Review of Systems  Constitutional: Negative for chills, fever and weight loss.  HENT: Negative for congestion and hearing loss.   Eyes: Negative for blurred vision and pain.  Respiratory: Negative for cough and shortness of breath.     Cardiovascular: Negative for chest pain and leg swelling.  Gastrointestinal: Negative for abdominal pain, constipation, diarrhea and heartburn.  Genitourinary: Negative for dysuria and frequency.       Breasts changes  Musculoskeletal: Negative for falls, joint pain and myalgias.  Neurological: Negative for dizziness, seizures and headaches.  Psychiatric/Behavioral: Negative for depression, substance abuse and suicidal ideas. The patient is nervous/anxious. The patient does not have insomnia.     BP 130/78   Pulse 86   Resp 18   Ht 5' 7"  (1.702 m)   Wt 221 lb (100.2 kg)   LMP 07/06/2013 Comment: serum pregnancy negative on 06/30/2013  SpO2 99%   BMI 34.61 kg/m   Physical Exam  Constitutional: She is oriented to person, place, and time. She appears well-developed and well-nourished.  HENT:  Head: Normocephalic and atraumatic.  Mouth/Throat: Oropharynx is clear and moist.  Eyes: Conjunctivae are normal. Pupils are equal, round, and reactive to light.  Neck: Normal range of motion. Neck supple.  Cardiovascular: Normal rate, regular rhythm and normal heart sounds.   Pulmonary/Chest: Effort normal and breath sounds normal. Right breast exhibits no inverted nipple, no mass, no nipple discharge, no skin change and no tenderness. Left breast exhibits skin change. Left breast exhibits no inverted nipple, no mass, no nipple discharge and no tenderness.    Abdominal: Soft. Bowel sounds are normal. There is no tenderness.  Musculoskeletal: Normal range of motion. She exhibits no edema.  Lymphadenopathy:    She has no cervical adenopathy.    She has no axillary adenopathy.  Neurological: She is alert and oriented to person, place, and time.  Skin:  Tanned. Discussed sunscreens and skin cancer  Psychiatric: She has a normal mood and affect. Her behavior is normal. Thought content normal.   ASSESSMENT/PLAN:  1. H/O: hysterectomy   2. Anxiety Discussed anxiety and depression.  Discussed SSRI medications. Prescribed fluoxetine. Discussed side effects. Patient understands and will take 4 weeks to reach affect. She will call if there is unpleasant side effects prior to that time. See me in 4 weeks to follow-up on medication efficacy.  3. Breast lump on left side at 12 o'clock position  - MM Digital Screening; Future  4. Encounter to establish care with new doctor    Patient Instructions  Mammogram ordered I will notify you of the result  Start fluoxetine every day Call for problems  See me in one month   Raylene Everts, MD

## 2016-01-23 NOTE — Addendum Note (Signed)
Addended by: Denman George B on: 01/23/2016 04:44 PM   Modules accepted: Orders

## 2016-01-28 ENCOUNTER — Other Ambulatory Visit: Payer: Self-pay

## 2016-01-28 ENCOUNTER — Telehealth: Payer: Self-pay | Admitting: Family Medicine

## 2016-01-28 DIAGNOSIS — N6325 Unspecified lump in the left breast, overlapping quadrants: Secondary | ICD-10-CM

## 2016-01-28 DIAGNOSIS — N6320 Unspecified lump in the left breast, unspecified quadrant: Principal | ICD-10-CM

## 2016-01-28 NOTE — Telephone Encounter (Signed)
Nancy [name] is calling from [facility] asking if Henry order could please be corrected by Dr. Meda Coffee, it needs to be as follows: MM Diag Brst Tomo Bil  AND RT U/S Breast Limited, Thanks

## 2016-01-28 NOTE — Telephone Encounter (Signed)
Orders entered

## 2016-01-29 ENCOUNTER — Encounter (HOSPITAL_COMMUNITY): Payer: BC Managed Care – PPO

## 2016-01-29 ENCOUNTER — Ambulatory Visit (HOSPITAL_COMMUNITY)
Admission: RE | Admit: 2016-01-29 | Discharge: 2016-01-29 | Disposition: A | Discharge status: Home / Self Care | Payer: BC Managed Care – PPO | Source: Ambulatory Visit | Attending: Family Medicine | Admitting: Family Medicine

## 2016-01-29 DIAGNOSIS — N6320 Unspecified lump in the left breast, unspecified quadrant: Principal | ICD-10-CM

## 2016-01-29 DIAGNOSIS — N6325 Unspecified lump in the left breast, overlapping quadrants: Secondary | ICD-10-CM

## 2016-02-21 ENCOUNTER — Encounter: Payer: Self-pay | Admitting: Family Medicine

## 2016-02-21 ENCOUNTER — Ambulatory Visit (INDEPENDENT_AMBULATORY_CARE_PROVIDER_SITE_OTHER): Payer: BC Managed Care – PPO | Admitting: Family Medicine

## 2016-02-21 VITALS — BP 138/94 | HR 84 | Temp 99.0°F | Resp 18 | Ht 67.0 in | Wt 223.0 lb

## 2016-02-21 DIAGNOSIS — Z23 Encounter for immunization: Secondary | ICD-10-CM

## 2016-02-21 DIAGNOSIS — E669 Obesity, unspecified: Secondary | ICD-10-CM | POA: Insufficient documentation

## 2016-02-21 DIAGNOSIS — E6609 Other obesity due to excess calories: Secondary | ICD-10-CM | POA: Diagnosis not present

## 2016-02-21 DIAGNOSIS — R030 Elevated blood-pressure reading, without diagnosis of hypertension: Secondary | ICD-10-CM

## 2016-02-21 DIAGNOSIS — K219 Gastro-esophageal reflux disease without esophagitis: Secondary | ICD-10-CM | POA: Insufficient documentation

## 2016-02-21 DIAGNOSIS — F419 Anxiety disorder, unspecified: Secondary | ICD-10-CM | POA: Diagnosis not present

## 2016-02-21 DIAGNOSIS — Z6835 Body mass index (BMI) 35.0-35.9, adult: Secondary | ICD-10-CM

## 2016-02-21 MED ORDER — FLUOXETINE HCL 20 MG PO TABS: 20.0000 mg | ORAL_TABLET | Freq: Every day | ORAL | 3 refills | Status: DC

## 2016-02-21 MED ORDER — OMEPRAZOLE 20 MG PO CPDR: 20.0000 mg | DELAYED_RELEASE_CAPSULE | Freq: Every day | ORAL | 3 refills | Status: DC

## 2016-02-21 NOTE — Progress Notes (Signed)
Chief Complaint  Patient presents with  . Follow-up    1 month  Nancy [name] is here for one-month follow-up. At her last visit she was started on fluoxetine 20 mg a day. She has been taking this daily. She feels improved. She has less anxiety. She states that she is sleeping better. This medicine is refilled. She has not noted any side effects Since her last visit she has gained 2 pounds. We talked again about the importance of daily exercise, monitoring her caloric intake. She needs to reduce portions. She needs to reduce high fat foods and fried foods. 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 She is going to remain a copy of her most recent labs. She is due for lipid and diabetes screening. Since her last visit she has been having more heartburn. She previously took the omeprazole with good results. She would like a prescription for omeprazole. She thinks the heart burn may be due to stress. She denies drinking alcohol, taking an NSAID medications or aspirin. Nonsmoker. She had an abnormal breast finding on her last examination. She had a mammogram since then. The mammogram was normal. She is reassured. Her blood pressure is elevated today. Her initial blood pressure was 138/94. When I repeated it was 150/104. She states she gets higher blood pressures when she is upset. She is going to take her blood pressure at home. She is going to come back for repeat blood pressures. She has never been diagnosed as having hypertension. She has no headaches dizzy spells or signs of elevated blood pressure.   Patient Active Problem List   Diagnosis Date Noted  . GERD (gastroesophageal reflux disease) 02/21/2016  . Obesity 02/21/2016  . H/O: hysterectomy 01/23/2016  . Anxiety 03/10/2013    Outpatient Encounter Prescriptions as of 02/21/2016  Medication Sig  . FLUoxetine (PROZAC) 20 MG tablet Take 1 tablet (20 mg total) by mouth daily.  Marland Kitchen omeprazole (PRILOSEC) 20 MG capsule Take 1 capsule  (20 mg total) by mouth daily.   No facility-administered encounter medications on file as of 02/21/2016.     No Known Allergies  Review of Systems  Constitutional: Negative for activity change, appetite change and fatigue.  HENT: Negative.   Eyes: Negative.  Negative for visual disturbance.  Respiratory: Negative.  Negative for cough and shortness of breath.   Cardiovascular: Negative.  Negative for chest pain, palpitations and leg swelling.  Gastrointestinal: Positive for abdominal pain. Negative for blood in stool, constipation and diarrhea.       Heartburn, acid reflux  Genitourinary: Negative.  Negative for menstrual problem.  Musculoskeletal: Negative.  Negative for arthralgias and back pain.  Neurological: Negative for dizziness and headaches.  Psychiatric/Behavioral: Negative for dysphoric mood and sleep disturbance. The patient is not nervous/anxious.        Feels better    BP (!) 138/94 (BP Location: Left Arm, Patient Position: Sitting, Cuff Size: Normal)   Pulse 84   Temp 99 F (37.2 C) (Oral)   Resp 18   Ht 5\' 7"  (1.702 m)   Wt 223 lb (101.2 kg)   LMP 07/06/2013 Comment: serum pregnancy negative on 06/30/2013  SpO2 99%   BMI 34.93 kg/m   Physical Exam  Constitutional: She is oriented to person, place, and time. She appears well-developed and well-nourished.  HENT:  Head: Normocephalic and atraumatic.  Mouth/Throat: Oropharynx is clear and moist.  Eyes: Conjunctivae are normal. Pupils are equal, round, and reactive to light.  Neck:  Normal range of motion. Neck supple. No thyromegaly present.  Cardiovascular: Normal rate, regular rhythm and normal heart sounds.   Pulmonary/Chest: Effort normal and breath sounds normal. No respiratory distress.  Abdominal: Soft. Bowel sounds are normal. There is no tenderness.  No epigastric tenderness. No organomegaly.  Lymphadenopathy:    She has no cervical adenopathy.  Neurological: She is alert and oriented to person,  place, and time.  Gait normal  Skin: Skin is warm and dry.  Psychiatric: She has a normal mood and affect. Her behavior is normal. Thought content normal.  Nursing note and vitals reviewed.   ASSESSMENT/PLAN:  1. Gastroesophageal reflux disease, esophagitis presence not specified Discussed GERD. Avoid spicy foods. Sleep with head of bed elevated. Avoid alcohol and aspirin products. Refill omeprazole.  2. Anxiety Continue fluoxetine  3. Class 2 obesity due to excess calories without serious comorbidity with body mass index (BMI) of 35.0 to 35.9 in adult Daily exercise recommended. Dietary restriction recommended.  4. Elevated blood pressure reading Patient will return for repeat   Patient Instructions  Continue the fluoxetine ( prozac) daily Take omeprazole once a day on an empty stomach  Bring me copy of labs when you get them  Continue to try to eat well and exercise every day that you are able  See me in six months  Call sooner for problems   Raylene Everts, MD

## 2016-02-21 NOTE — Patient Instructions (Addendum)
Continue the fluoxetine ( prozac) daily Take omeprazole once a day on an empty stomach  Bring me copy of labs when you get them  Continue to try to eat well and exercise every day that you are able  See me in six months  Call sooner for problems

## 2016-08-21 ENCOUNTER — Ambulatory Visit (INDEPENDENT_AMBULATORY_CARE_PROVIDER_SITE_OTHER): Payer: BC Managed Care – PPO | Admitting: Family Medicine

## 2016-08-21 ENCOUNTER — Encounter: Payer: Self-pay | Admitting: Family Medicine

## 2016-08-21 VITALS — BP 138/86 | HR 80 | Temp 98.6°F | Resp 18 | Ht 67.0 in | Wt 227.0 lb

## 2016-08-21 DIAGNOSIS — Z1211 Encounter for screening for malignant neoplasm of colon: Secondary | ICD-10-CM

## 2016-08-21 DIAGNOSIS — Z6835 Body mass index (BMI) 35.0-35.9, adult: Secondary | ICD-10-CM

## 2016-08-21 DIAGNOSIS — E6609 Other obesity due to excess calories: Secondary | ICD-10-CM

## 2016-08-21 LAB — LIPID PANEL
Cholesterol: 219 mg/dL — ABNORMAL HIGH (ref <200)
HDL: 50 mg/dL — ABNORMAL LOW (ref >50)
LDL CALC: 136 mg/dL — AB (ref <100)
Total CHOL/HDL Ratio: 4.4 Ratio (ref <5.0)
Triglycerides: 165 mg/dL — ABNORMAL HIGH (ref <150)
VLDL: 33 mg/dL — ABNORMAL HIGH (ref <30)

## 2016-08-21 LAB — CBC
HCT: 38.8 % (ref 35.0–45.0)
Hemoglobin: 13.3 g/dL (ref 11.7–15.5)
MCH: 30.3 pg (ref 27.0–33.0)
MCHC: 34.3 g/dL (ref 32.0–36.0)
MCV: 88.4 fL (ref 80.0–100.0)
MPV: 10.2 fL (ref 7.5–12.5)
Platelets: 256 10*3/uL (ref 140–400)
RBC: 4.39 MIL/uL (ref 3.80–5.10)
RDW: 13.8 % (ref 11.0–15.0)
WBC: 6.6 10*3/uL (ref 3.8–10.8)

## 2016-08-21 LAB — COMPLETE METABOLIC PANEL WITH GFR
AG RATIO: 1.9 ratio (ref 1.0–2.5)
ALT: 35 U/L — ABNORMAL HIGH (ref 6–29)
AST: 24 U/L (ref 10–35)
Albumin: 4.4 g/dL (ref 3.6–5.1)
Alkaline Phosphatase: 70 U/L (ref 33–130)
BUN / CREAT RATIO: 11.8 ratio (ref 6–22)
BUN: 9 mg/dL (ref 7–25)
CHLORIDE: 103 mmol/L (ref 98–110)
CO2: 25 mmol/L (ref 20–31)
Calcium: 9.1 mg/dL (ref 8.6–10.4)
Creat: 0.76 mg/dL (ref 0.50–1.05)
GFR, Est Non African American: >89 mL/min (ref >=60)
Globulin: 2.3 g/dL (ref 1.9–3.7)
Glucose, Bld: 87 mg/dL (ref 65–99)
POTASSIUM: 4 mmol/L (ref 3.5–5.3)
SODIUM: 138 mmol/L (ref 135–146)
Total Bilirubin: 0.4 mg/dL (ref 0.2–1.2)
Total Protein: 6.7 g/dL (ref 6.1–8.1)

## 2016-08-21 NOTE — Patient Instructions (Signed)
Come back October  You need a Pap and PE at that time Need blood work as discussed Continue same medicines  Call sooner for problems

## 2016-08-21 NOTE — Progress Notes (Signed)
Chief Complaint  Patient presents with  . Follow-up    6 month   Patient is here for follow-up. She was started on fluoxetine for anxiety. She is happy to report that this is working well for her. She is happy with her job. She is looking forward to the future. She has been more active. No side effects reported. She was given Prilosec for GERD. She took a daily initially. She is reduced now to when necessary. She is watching her diet. She is to for her Pap smear and mammogram this fall in October or November. She'll come back at that time. Her shots are up-to-date.  Patient Active Problem List   Diagnosis Date Noted  . GERD (gastroesophageal reflux disease) 02/21/2016  . Obesity 02/21/2016  . H/O: hysterectomy 01/23/2016  . Anxiety 03/10/2013    Outpatient Encounter Prescriptions as of 08/21/2016  Medication Sig  . FLUoxetine (PROZAC) 20 MG tablet Take 1 tablet (20 mg total) by mouth daily.  Marland Kitchen omeprazole (PRILOSEC) 20 MG capsule Take 1 capsule (20 mg total) by mouth daily.   No facility-administered encounter medications on file as of 08/21/2016.     No Known Allergies  Review of Systems  Constitutional: Negative for activity change, appetite change and unexpected weight change.  HENT: Negative for congestion, dental problem, postnasal drip and rhinorrhea.   Eyes: Negative for redness and visual disturbance.  Respiratory: Negative for cough and shortness of breath.   Cardiovascular: Negative for chest pain, palpitations and leg swelling.  Gastrointestinal: Negative for abdominal pain, constipation and diarrhea.  Genitourinary: Negative for difficulty urinating and frequency.  Musculoskeletal: Negative for arthralgias and back pain.  Neurological: Negative for dizziness and headaches.  Psychiatric/Behavioral: Negative for dysphoric mood and sleep disturbance. The patient is not nervous/anxious.     BP 138/86   Pulse 80   Temp 98.6 F (37 C) (Temporal)   Resp 18   Ht 5'  7" (1.702 m)   Wt 227 lb 0.6 oz (103 kg)   LMP 07/06/2013 Comment: serum pregnancy negative on 06/30/2013  SpO2 97%   BMI 35.56 kg/m   Physical Exam  Constitutional: She is oriented to person, place, and time. She appears well-developed and well-nourished.  Pleasant. Smiling. Mildly overweight  HENT:  Head: Normocephalic and atraumatic.  Right Ear: External ear normal.  Left Ear: External ear normal.  Mouth/Throat: Oropharynx is clear and moist.  Eyes: Conjunctivae are normal. Pupils are equal, round, and reactive to light.  Neck: Normal range of motion. Neck supple. No thyromegaly present.  Cardiovascular: Normal rate, regular rhythm and normal heart sounds.   Pulmonary/Chest: Effort normal and breath sounds normal. No respiratory distress.  Abdominal: Soft. Bowel sounds are normal.  Musculoskeletal: Normal range of motion. She exhibits no edema.  Lymphadenopathy:    She has no cervical adenopathy.  Neurological: She is alert and oriented to person, place, and time.  Gait normal  Skin: Skin is warm and dry.  Psychiatric: She has a normal mood and affect. Her behavior is normal. Thought content normal.  Nursing note and vitals reviewed.   ASSESSMENT/PLAN:  1. Class 2 obesity due to excess calories without serious comorbidity with body mass index (BMI) of 35.0 to 35.9 in adult Blood pressure initially elevated. It came down with rest. - CBC - COMPLETE METABOLIC PANEL WITH GFR - Lipid panel - VITAMIN D 25 Hydroxy (Vit-D Deficiency, Fractures) - Urinalysis, Routine w reflex microscopic - Hemoglobin A1c  2. Screen for colon cancer  -  Ambulatory referral to Gastroenterology   Patient Instructions  Come back October  You need a Pap and PE at that time Need blood work as discussed Continue same medicines  Call sooner for problems   Raylene Everts, MD

## 2016-08-22 LAB — URINALYSIS, ROUTINE W REFLEX MICROSCOPIC
Bilirubin Urine: NEGATIVE
Glucose, UA: NEGATIVE
Ketones, ur: NEGATIVE
LEUKOCYTES UA: NEGATIVE
NITRITE: NEGATIVE
PROTEIN: NEGATIVE
Specific Gravity, Urine: 1.005 (ref 1.001–1.035)
pH: 6 (ref 5.0–8.0)

## 2016-08-22 LAB — URINALYSIS, MICROSCOPIC ONLY
Bacteria, UA: NONE SEEN [HPF]
CRYSTALS: NONE SEEN [HPF]
Casts: NONE SEEN [LPF]
RBC / HPF: NONE SEEN RBC/HPF (ref <=2)
Squamous Epithelial / LPF: NONE SEEN [HPF] (ref <=5)
WBC UA: NONE SEEN WBC/HPF (ref <=5)
Yeast: NONE SEEN [HPF]

## 2016-08-22 LAB — VITAMIN D 25 HYDROXY (VIT D DEFICIENCY, FRACTURES): VIT D 25 HYDROXY: 30 ng/mL (ref 30–100)

## 2016-08-22 LAB — HEMOGLOBIN A1C
HEMOGLOBIN A1C: 5.2 % (ref <5.7)
MEAN PLASMA GLUCOSE: 103 mg/dL

## 2016-08-28 ENCOUNTER — Telehealth: Payer: Self-pay

## 2016-08-28 NOTE — Telephone Encounter (Addendum)
Gastroenterology Pre-Procedure Review  Request Date: 08/28/2016 Requesting Physician: Blanchie Serve, MD  This will be pt's first colonoscopy. Screening.   PATIENT REVIEW QUESTIONS: The patient responded to the following health history questions as indicated:    1. Diabetes Melitis: no 2. Joint replacements in the past 12 months: no 3. Major health problems in the past 3 months: no 4. Has an artificial valve or MVP: no 5. Has a defibrillator: no 6. Has been advised in past to take antibiotics in advance of a procedure like teeth cleaning: no 7. Family history of colon cancer: no  8. Alcohol Use: no 9. History of sleep apnea: no  10. History of coronary artery or other vascular stents placed within the last 12 months: no    MEDICATIONS & ALLERGIES:    Patient reports the following regarding taking any blood thinners:   Plavix? no Aspirin? no Coumadin? no Brilinta? no Xarelto? no Eliquis? no Pradaxa? no Savaysa? no Effient? no  Patient confirms/reports the following medications:  Current Outpatient Prescriptions  Medication Sig Dispense Refill  . FLUoxetine (PROZAC) 20 MG tablet Take 1 tablet (20 mg total) by mouth daily. 90 tablet 3  . omeprazole (PRILOSEC) 20 MG capsule Take 1 capsule (20 mg total) by mouth daily. 90 capsule 3   No current facility-administered medications for this visit.     Patient confirms/reports the following allergies:  No Known Allergies  No orders of the defined types were placed in this encounter.   AUTHORIZATION INFORMATION Primary Insurance:   ID #:   Group #:  Pre-Cert / Auth required:  Pre-Cert / Auth #:   Secondary Insurance:   ID #:   Group #:  Pre-Cert / Auth required: Pre-Cert / Auth #:   SCHEDULE INFORMATION: Procedure has been scheduled as follows:  Date: 10/09/2016         Time: 9:30 AM  Location: [facility] Short Stay  This Gastroenterology Pre-Precedure Review Form is being routed to the following provider(s): R.  Garfield Cornea, MD

## 2016-08-28 NOTE — Telephone Encounter (Signed)
LMOM to call to schedule the colonoscopy.

## 2016-09-01 ENCOUNTER — Other Ambulatory Visit: Payer: Self-pay

## 2016-09-01 DIAGNOSIS — Z1211 Encounter for screening for malignant neoplasm of colon: Secondary | ICD-10-CM

## 2016-09-01 MED ORDER — PEG 3350-KCL-NA BICARB-NACL 420 G PO SOLR: 4000.0000 mL | ORAL | 0 refills | Status: DC

## 2016-09-01 NOTE — Telephone Encounter (Signed)
Phenergan 12.5 mg IV on call.

## 2016-09-01 NOTE — Telephone Encounter (Signed)
Rx sent to the pharmacy and instructions mailed to pt. Phenergan orders entered.

## 2016-10-09 ENCOUNTER — Encounter (HOSPITAL_COMMUNITY): Payer: Self-pay | Admitting: *Deleted

## 2016-10-09 ENCOUNTER — Encounter (HOSPITAL_COMMUNITY)
Admission: RE | Disposition: A | Discharge status: Home / Self Care | Payer: Self-pay | Source: Ambulatory Visit | Attending: Internal Medicine

## 2016-10-09 ENCOUNTER — Ambulatory Visit (HOSPITAL_COMMUNITY)
Admission: RE | Admit: 2016-10-09 | Discharge: 2016-10-09 | Disposition: A | Discharge status: Home / Self Care | Payer: BC Managed Care – PPO | Source: Ambulatory Visit | Attending: Internal Medicine | Admitting: Internal Medicine

## 2016-10-09 DIAGNOSIS — F419 Anxiety disorder, unspecified: Secondary | ICD-10-CM | POA: Diagnosis not present

## 2016-10-09 DIAGNOSIS — Z1212 Encounter for screening for malignant neoplasm of rectum: Secondary | ICD-10-CM | POA: Diagnosis not present

## 2016-10-09 DIAGNOSIS — K219 Gastro-esophageal reflux disease without esophagitis: Secondary | ICD-10-CM | POA: Diagnosis not present

## 2016-10-09 DIAGNOSIS — Z1211 Encounter for screening for malignant neoplasm of colon: Secondary | ICD-10-CM | POA: Diagnosis not present

## 2016-10-09 DIAGNOSIS — F329 Major depressive disorder, single episode, unspecified: Secondary | ICD-10-CM | POA: Diagnosis not present

## 2016-10-09 DIAGNOSIS — Z79899 Other long term (current) drug therapy: Secondary | ICD-10-CM | POA: Insufficient documentation

## 2016-10-09 HISTORY — PX: COLONOSCOPY: SHX5424

## 2016-10-09 SURGERY — COLONOSCOPY
Anesthesia: Moderate Sedation

## 2016-10-09 MED ORDER — MEPERIDINE HCL 100 MG/ML IJ SOLN
INTRAMUSCULAR | Status: DC
Start: 2016-10-09 — End: 2016-10-09
  Filled 2016-10-09: qty 2

## 2016-10-09 MED ORDER — STERILE WATER FOR IRRIGATION IR SOLN
Status: DC | PRN
  Administered 2016-10-09: 10:00:00

## 2016-10-09 MED ORDER — SODIUM CHLORIDE 0.9 % IV SOLN
INTRAVENOUS | Status: DC
  Administered 2016-10-09: 09:00:00 via INTRAVENOUS

## 2016-10-09 MED ORDER — PROMETHAZINE HCL 25 MG/ML IJ SOLN
INTRAMUSCULAR | Status: AC
  Administered 2016-10-09: 12.5 mg
  Filled 2016-10-09: qty 1

## 2016-10-09 MED ORDER — SODIUM CHLORIDE 0.9% FLUSH
INTRAVENOUS | Status: AC
  Filled 2016-10-09: qty 10

## 2016-10-09 MED ORDER — MEPERIDINE HCL 100 MG/ML IJ SOLN
INTRAMUSCULAR | Status: DC | PRN
  Administered 2016-10-09: 25 mg
  Administered 2016-10-09: 50 mg

## 2016-10-09 MED ORDER — ONDANSETRON HCL 4 MG/2ML IJ SOLN
INTRAMUSCULAR | Status: AC
  Filled 2016-10-09: qty 2

## 2016-10-09 MED ORDER — MIDAZOLAM HCL 5 MG/5ML IJ SOLN
INTRAMUSCULAR | Status: DC | PRN
  Administered 2016-10-09: 2 mg via INTRAVENOUS
  Administered 2016-10-09: 1 mg via INTRAVENOUS
  Administered 2016-10-09: 2 mg via INTRAVENOUS

## 2016-10-09 MED ORDER — ONDANSETRON HCL 4 MG/2ML IJ SOLN
INTRAMUSCULAR | Status: DC | PRN
  Administered 2016-10-09: 4 mg via INTRAVENOUS

## 2016-10-09 MED ORDER — MIDAZOLAM HCL 5 MG/5ML IJ SOLN
INTRAMUSCULAR | Status: AC
  Filled 2016-10-09: qty 10

## 2016-10-09 NOTE — Op Note (Signed)
[facility] Patient Name: Nancy [name] Procedure Date: 10/09/2016 9:30 AM MRN: [id number] Date of Birth: 04/27/1966 Attending MD: Norvel Richards , MD CSN: [id number] Age: 52 Admit Type: Outpatient Procedure:                Colonoscopy Indications:              Screening for colorectal malignant neoplasm Providers:                Norvel Richards, MD, Janeece Riggers, RN, Randa Spike, Technician Referring MD:              Medicines:                Midazolam 5 mg IV, Meperidine 75 mg IV, Ondansetron                            4 mg IV, Promethazine 35.3 mg IV Complications:            No immediate complications. Estimated Blood Loss:     Estimated blood loss: none. Procedure:                Pre-Anesthesia Assessment:                           - Prior to the procedure, a History and Physical                            was performed, and patient medications and                            allergies were reviewed. The patient's tolerance of                            previous anesthesia was also reviewed. The risks                            and benefits of the procedure and the sedation                            options and risks were discussed with the patient.                            All questions were answered, and informed consent                            was obtained. Prior Anticoagulants: The patient has                            taken no previous anticoagulant or antiplatelet                            agents. ASA Grade Assessment: II - A patient with  mild systemic disease. After reviewing the risks                            and benefits, the patient was deemed in                            satisfactory condition to undergo the procedure.                           After obtaining informed consent, the colonoscope                            was passed under direct vision. Throughout the                             procedure, the patient's blood pressure, pulse, and                            oxygen saturations were monitored continuously. The                            EC-[id number] ([id number]) scope was introduced through                            the anus and advanced to the the cecum, identified                            by appendiceal orifice and ileocecal valve. The                            colonoscopy was performed without difficulty. The                            patient tolerated the procedure well. The entire                            colon was well visualized. The quality of the bowel                            preparation was adequate. The ileocecal valve,                            appendiceal orifice, and rectum were photographed. Scope In: 9:48:30 AM Scope Out: 10:01:05 AM Scope Withdrawal Time: 0 hours 6 minutes 41 seconds  Total Procedure Duration: 0 hours 12 minutes 35 seconds  Findings:      The perianal and digital rectal examinations were normal.      The colon (entire examined portion) appeared normal.      No additional abnormalities were found on retroflexion. Impression:               - The entire examined colon is normal.                           - No specimens collected. Moderate Sedation:  Moderate (conscious) sedation was administered by the endoscopy nurse       and supervised by the endoscopist. The following parameters were       monitored: oxygen saturation, heart rate, blood pressure, respiratory       rate, EKG, adequacy of pulmonary ventilation, and response to care.       Total physician intraservice time was 23 minutes. Recommendation:           - Patient has a contact number available for                            emergencies. The signs and symptoms of potential                            delayed complications were discussed with the                            patient. Return to normal activities tomorrow.                            Written  discharge instructions were provided to the                            patient.                           - Resume previous diet.                           - Continue present medications.                           - Repeat colonoscopy in 10 years for screening                            purposes.                           - Return to GI clinic PRN. Procedure Code(s):        --- Professional ---                           [phone], Colonoscopy, flexible; diagnostic, including                            collection of specimen(s) by brushing or washing,                            when performed (separate procedure)                           99152, Moderate sedation services provided by the                            same physician or other qualified health care  professional performing the diagnostic or                            therapeutic service that the sedation supports,                            requiring the presence of an independent trained                            observer to assist in the monitoring of the                            patient's level of consciousness and physiological                            status; initial 15 minutes of intraservice time,                            patient age 48 years or older                           [phone], Moderate sedation services; each additional                            15 minutes intraservice time Diagnosis Code(s):        --- Professional ---                           Z12.11, Encounter for screening for malignant                            neoplasm of colon CPT copyright 2016 [employer]. All rights reserved. The codes documented in this report are preliminary and upon coder review may  be revised to meet current compliance requirements. Nancy [name]. Nancy Morrisette, MD Norvel Richards, MD 10/09/2016 10:50:00 AM This report has been signed electronically. Number of Addenda: 0

## 2016-10-09 NOTE — H&P (Signed)
$'@LOGO'n$ @   Primary Care Physician:  Nancy Everts, MD Primary Gastroenterologist:  Dr. Gala [name]  Pre-Procedure History & Physical: HPI:  Nancy [name] is a 51 y.o. female is here for a screening colonoscopy. No bowel symptoms. No family history of colon cancer.  No prior colonoscopy.  .  Past Medical History:  Diagnosis Date  . Anxiety   . Depression   . GERD (gastroesophageal reflux disease)   . Irregular bleeding 03/10/2013    Past Surgical History:  Procedure Laterality Date  . ABDOMINAL HYSTERECTOMY     heavy irreg bleeding  . CHOLECYSTECTOMY    . DILATION AND CURETTAGE OF UTERUS    . ENDOMETRIAL ABLATION    . SALPINGOOPHORECTOMY Bilateral 07/06/2013   Procedure: SALPINGO OOPHORECTOMY;  Surgeon: Nancy Buff, MD;  Location: [facility];  Service: Gynecology;  Laterality: Bilateral;  . SUPRACERVICAL ABDOMINAL HYSTERECTOMY N/A 07/06/2013   Procedure: HYSTERECTOMY SUPRACERVICAL ABDOMINAL;  Surgeon: Nancy Buff, MD;  Location: [facility];  Service: Gynecology;  Laterality: N/A;  . TUBAL LIGATION      Prior to Admission medications   Medication Sig Start Date End Date Taking? Authorizing Provider  FLUoxetine (PROZAC) 20 MG tablet Take 1 tablet (20 mg total) by mouth daily. Patient taking differently: Take 20 mg by mouth at bedtime.  02/21/16  Yes Nancy Everts, MD  omeprazole (PRILOSEC) 20 MG capsule Take 1 capsule (20 mg total) by mouth daily. Patient taking differently: Take 20 mg by mouth daily as needed (heartburn).  02/21/16  Yes Nancy Everts, MD  polyethylene glycol-electrolytes (TRILYTE) 420 g solution Take 4,000 mLs by mouth as directed. 09/01/16  Yes Nancy Dolin, MD    Allergies as of 09/01/2016  . (No Known Allergies)    Family History  Problem Relation Age of Onset  . COPD Mother   . Heart disease Mother        CHF  . Cancer Father        kidney met to lung  . Heart attack Father 46       died of heart attack  . Hypertension Maternal  Grandmother   . Heart disease Maternal Grandmother   . Hypertension Maternal Grandfather   . Heart disease Maternal Grandfather   . Hypertension Paternal Grandmother   . Heart disease Paternal Grandmother   . Hypertension Paternal Grandfather   . Heart disease Paternal Grandfather   . Cancer Sister 20       lung,renal  . Alcohol abuse Sister     Social History   Social History  . Marital status: Divorced    Spouse name: N/A  . Number of children: 2  . Years of education: 16   Occupational History  . 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 Smoking status: Never Smoker  . Smokeless tobacco: Never Used  . Alcohol use No  . Drug use: No  . Sexual activity: Yes    Birth control/ protection: Surgical   Other Topics Concern  . Not on file   Social History Narrative   Lives at home with fiancee   Daughter 29 has 4 children   Daughter 60 lives in [place]: See HPI, otherwise negative ROS  Physical Exam: BP 140/85   Pulse 75   Temp 98.5 F (36.9 C) (Oral)   Ht '5\' 6"'$  (1.676 m)   Wt 211 lb (95.7 kg)   LMP 07/06/2013 Comment:  serum pregnancy negative on 06/30/2013  SpO2 98%   BMI 34.06 kg/m  General:   Alert,  Well-developed, well-nourished, pleasant and cooperative in NAD Head:  Normocephalic and atraumatic. Lungs:  Clear throughout to auscultation.   No wheezes, crackles, or rhonchi. No acute distress. Heart:  Regular rate and rhythm; no murmurs, clicks, rubs,  or gallops. Abdomen:  Soft, nontender and nondistended. No masses, hepatosplenomegaly or hernias noted. Normal bowel sounds, without guarding, and without rebound.      Impression/Plan: Nancy [name] is now here to undergo a screening colonoscopy.  First ever average risk screening examination.  Risks, benefits, limitations, imponderables and alternatives regarding colonoscopy have been reviewed with the patient. Questions have been answered. All  parties agreeable.     Notice:  This dictation was prepared with Dragon dictation along with smaller phrase technology. Any transcriptional errors that result from this process are unintentional and may not be corrected upon review.

## 2016-10-09 NOTE — Discharge Instructions (Addendum)
°  Colonoscopy Discharge Instructions  Read the instructions outlined below and refer to this sheet in the next few weeks. These discharge instructions provide you with general information on caring for yourself after you leave the hospital. Your doctor may also give you specific instructions. While your treatment has been planned according to the most current medical practices available, unavoidable complications occasionally occur. If you have any problems or questions after discharge, call Dr. Gala Romney at [phone]. ACTIVITY  You may resume your regular activity, but move at a slower pace for the next 24 hours.   Take frequent rest periods for the next 24 hours.   Walking will help get rid of the air and reduce the bloated feeling in your belly (abdomen).   No driving for 24 hours (because of the medicine (anesthesia) used during the test).    Do not sign any important legal documents or operate any machinery for 24 hours (because of the anesthesia used during the test).  NUTRITION  Drink plenty of fluids.   You may resume your normal diet as instructed by your doctor.   Begin with a light meal and progress to your normal diet. Heavy or fried foods are harder to digest and may make you feel sick to your stomach (nauseated).   Avoid alcoholic beverages for 24 hours or as instructed.  MEDICATIONS  You may resume your normal medications unless your doctor tells you otherwise.  WHAT YOU CAN EXPECT TODAY  Some feelings of bloating in the abdomen.   Passage of more gas than usual.   Spotting of blood in your stool or on the toilet paper.  IF YOU HAD POLYPS REMOVED DURING THE COLONOSCOPY:  No aspirin products for 7 days or as instructed.   No alcohol for 7 days or as instructed.   Eat a soft diet for the next 24 hours.  FINDING OUT THE RESULTS OF YOUR TEST Not all test results are available during your visit. If your test results are not back during the visit, make an appointment  with your caregiver to find out the results. Do not assume everything is normal if you have not heard from your caregiver or the medical facility. It is important for you to follow up on all of your test results.  SEEK IMMEDIATE MEDICAL ATTENTION IF:  You have more than a spotting of blood in your stool.   Your belly is swollen (abdominal distention).   You are nauseated or vomiting.   You have a temperature over 101.   You have abdominal pain or discomfort that is severe or gets worse throughout the day.   Repeat colonoscopy in 10 years for screening purposes

## 2016-10-14 ENCOUNTER — Encounter (HOSPITAL_COMMUNITY): Payer: Self-pay | Admitting: Internal Medicine

## 2017-01-27 ENCOUNTER — Ambulatory Visit (INDEPENDENT_AMBULATORY_CARE_PROVIDER_SITE_OTHER): Payer: BC Managed Care – PPO | Admitting: Family Medicine

## 2017-01-27 ENCOUNTER — Encounter: Payer: Self-pay | Admitting: Family Medicine

## 2017-01-27 VITALS — BP 144/100 | HR 80 | Temp 96.9°F | Resp 18 | Ht 66.0 in | Wt 233.1 lb

## 2017-01-27 DIAGNOSIS — Z0000 Encounter for general adult medical examination without abnormal findings: Secondary | ICD-10-CM

## 2017-01-27 DIAGNOSIS — Z6835 Body mass index (BMI) 35.0-35.9, adult: Secondary | ICD-10-CM | POA: Diagnosis not present

## 2017-01-27 DIAGNOSIS — E6609 Other obesity due to excess calories: Secondary | ICD-10-CM | POA: Diagnosis not present

## 2017-01-27 DIAGNOSIS — Z1231 Encounter for screening mammogram for malignant neoplasm of breast: Secondary | ICD-10-CM | POA: Diagnosis not present

## 2017-01-27 DIAGNOSIS — R030 Elevated blood-pressure reading, without diagnosis of hypertension: Secondary | ICD-10-CM | POA: Diagnosis not present

## 2017-01-27 DIAGNOSIS — Z1239 Encounter for other screening for malignant neoplasm of breast: Secondary | ICD-10-CM

## 2017-01-27 DIAGNOSIS — Z23 Encounter for immunization: Secondary | ICD-10-CM | POA: Diagnosis not present

## 2017-01-27 NOTE — Patient Instructions (Addendum)
See me in 2-3 months   Hypertension Hypertension, commonly called high blood pressure, is when the force of blood pumping through the arteries is too strong. The arteries are the blood vessels that carry blood from the heart throughout the body. Hypertension forces the heart to work harder to pump blood and may cause arteries to become narrow or stiff. Having untreated or uncontrolled hypertension can cause heart attacks, strokes, kidney disease, and other problems. A blood pressure reading consists of a higher number over a lower number. Ideally, your blood pressure should be below 120/80. The first ("top") number is called the systolic pressure. It is a measure of the pressure in your arteries as your heart beats. The second ("bottom") number is called the diastolic pressure. It is a measure of the pressure in your arteries as the heart relaxes. What are the causes? The cause of this condition is not known. What increases the risk? Some risk factors for high blood pressure are under your control. Others are not. Factors you can change  Smoking.  Having type 2 diabetes mellitus, high cholesterol, or both.  Not getting enough exercise or physical activity.  Being overweight.  Having too much fat, sugar, calories, or salt (sodium) in your diet.  Drinking too much alcohol. Factors that are difficult or impossible to change  Having chronic kidney disease.  Having a family history of high blood pressure.  Age. Risk increases with age.  Race. You may be at higher risk if you are African-American.  Gender. Men are at higher risk than women before age 45. After age 54, women are at higher risk than men.  Having obstructive sleep apnea.  Stress. What are the signs or symptoms? Extremely high blood pressure (hypertensive crisis) may cause:  Headache.  Anxiety.  Shortness of breath.  Nosebleed.  Nausea and vomiting.  Severe chest pain.  Jerky movements you cannot control  (seizures).  How is this diagnosed? This condition is diagnosed by measuring your blood pressure while you are seated, with your arm resting on a surface. The cuff of the blood pressure monitor will be placed directly against the skin of your upper arm at the level of your heart. It should be measured at least twice using the same arm. Certain conditions can cause a difference in blood pressure between your right and left arms. Certain factors can cause blood pressure readings to be lower or higher than normal (elevated) for a short period of time:  When your blood pressure is higher when you are in a health care provider's office than when you are at home, this is called white coat hypertension. Most people with this condition do not need medicines.  When your blood pressure is higher at home than when you are in a health care provider's office, this is called masked hypertension. Most people with this condition may need medicines to control blood pressure.  If you have a high blood pressure reading during one visit or you have normal blood pressure with other risk factors:  You may be asked to return on a different day to have your blood pressure checked again.  You may be asked to monitor your blood pressure at home for 1 week or longer.  If you are diagnosed with hypertension, you may have other blood or imaging tests to help your health care provider understand your overall risk for other conditions. How is this treated? This condition is treated by making healthy lifestyle changes, such as eating healthy foods, exercising  more, and reducing your alcohol intake. Your health care provider may prescribe medicine if lifestyle changes are not enough to get your blood pressure under control, and if:  Your systolic blood pressure is above 130.  Your diastolic blood pressure is above 80.  Your personal target blood pressure may vary depending on your medical conditions, your age, and other  factors. Follow these instructions at home: Eating and drinking  Eat a diet that is high in fiber and potassium, and low in sodium, added sugar, and fat. An example eating plan is called the DASH (Dietary Approaches to Stop Hypertension) diet. To eat this way: ? Eat plenty of fresh fruits and vegetables. Try to fill half of your plate at each meal with fruits and vegetables. ? Eat whole grains, such as whole wheat pasta, brown rice, or whole grain bread. Fill about one quarter of your plate with whole grains. ? Eat or drink low-fat dairy products, such as skim milk or low-fat yogurt. ? Avoid fatty cuts of meat, processed or cured meats, and poultry with skin. Fill about one quarter of your plate with lean proteins, such as fish, chicken without skin, beans, eggs, and tofu. ? Avoid premade and processed foods. These tend to be higher in sodium, added sugar, and fat.  Reduce your daily sodium intake. Most people with hypertension should eat less than 1,500 mg of sodium a day.  Limit alcohol intake to no more than 1 drink a day for nonpregnant women and 2 drinks a day for men. One drink equals 12 oz of beer, 5 oz of wine, or 1 oz of hard liquor. Lifestyle  Work with your health care provider to maintain a healthy body weight or to lose weight. Ask what an ideal weight is for you.  Get at least 30 minutes of exercise that causes your heart to beat faster (aerobic exercise) most days of the week. Activities may include walking, swimming, or biking.  Include exercise to strengthen your muscles (resistance exercise), such as pilates or lifting weights, as part of your weekly exercise routine. Try to do these types of exercises for 30 minutes at least 3 days a week.  Do not use any products that contain nicotine or tobacco, such as cigarettes and e-cigarettes. If you need help quitting, ask your health care provider.  Monitor your blood pressure at home as told by your health care provider.  Keep  all follow-up visits as told by your health care provider. This is important. Medicines  Take over-the-counter and prescription medicines only as told by your health care provider. Follow directions carefully. Blood pressure medicines must be taken as prescribed.  Do not skip doses of blood pressure medicine. Doing this puts you at risk for problems and can make the medicine less effective.  Ask your health care provider about side effects or reactions to medicines that you should watch for. Contact a health care provider if:  You think you are having a reaction to a medicine you are taking.  You have headaches that keep coming back (recurring).  You feel dizzy.  You have swelling in your ankles.  You have trouble with your vision. Get help right away if:  You develop a severe headache or confusion.  You have unusual weakness or numbness.  You feel faint.  You have severe pain in your chest or abdomen.  You vomit repeatedly.  You have trouble breathing. Summary  Hypertension is when the force of blood pumping through your arteries is  too strong. If this condition is not controlled, it may put you at risk for serious complications.  Your personal target blood pressure may vary depending on your medical conditions, your age, and other factors. For most people, a normal blood pressure is less than 120/80.  Hypertension is treated with lifestyle changes, medicines, or a combination of both. Lifestyle changes include weight loss, eating a healthy, low-sodium diet, exercising more, and limiting alcohol. This information is not intended to replace advice given to you by your health care provider. Make sure you discuss any questions you have with your health care provider. Document Released: 04/14/2005 Document Revised: 03/12/2016 Document Reviewed: 03/12/2016 Elsevier Interactive Patient Education  [employer].

## 2017-01-27 NOTE — Progress Notes (Signed)
Chief Complaint  Patient presents with  . Annual Exam  here for PE No complaint Has gained 20 lbs BP is up Discussed lifestyle changes Due for mammo No PAP due to hysterectomy status for benign disease No reg exercise [place] normal in June this year   Patient Active Problem List   Diagnosis Date Noted  . GERD (gastroesophageal reflux disease) 02/21/2016  . Obesity 02/21/2016  . H/O: hysterectomy 01/23/2016  . Anxiety 03/10/2013    Outpatient Encounter Prescriptions as of 01/27/2017  Medication Sig  . FLUoxetine (PROZAC) 20 MG tablet Take 1 tablet (20 mg total) by mouth daily. (Patient taking differently: Take 20 mg by mouth at bedtime. )  . omeprazole (PRILOSEC) 20 MG capsule Take 1 capsule (20 mg total) by mouth daily. (Patient taking differently: Take 20 mg by mouth daily as needed (heartburn). )  . polyethylene glycol-electrolytes (TRILYTE) 420 g solution Take 4,000 mLs by mouth as directed.   No facility-administered encounter medications on file as of 01/27/2017.     No Known Allergies  Review of Systems  Constitutional: Negative for activity change, appetite change and unexpected weight change.  HENT: Negative for congestion, dental problem, postnasal drip and rhinorrhea.   Eyes: Negative for redness and visual disturbance.  Respiratory: Negative for cough and shortness of breath.   Cardiovascular: Negative for chest pain, palpitations and leg swelling.  Gastrointestinal: Negative for abdominal pain, constipation and diarrhea.  Genitourinary: Negative for difficulty urinating, frequency, vaginal bleeding and vaginal discharge.  Musculoskeletal: Negative for arthralgias and back pain.  Neurological: Negative for dizziness and headaches.  Psychiatric/Behavioral: Negative for dysphoric mood and sleep disturbance. The patient is not nervous/anxious.     BP (!) 144/100   Pulse 80   Temp (!) 96.9 F (36.1 C) (Temporal)   Resp 18   Ht 5\' 6"  (1.676 m)   Wt 233 lb  1.9 oz (105.7 kg)   LMP 07/06/2013 Comment: serum pregnancy negative on 06/30/2013  SpO2 97%   BMI 37.63 kg/m   Physical Exam BP (!) 144/100   Pulse 80   Temp (!) 96.9 F (36.1 C) (Temporal)   Resp 18   Ht 5\' 6"  (1.676 m)   Wt 233 lb 1.9 oz (105.7 kg)   LMP 07/06/2013 Comment: serum pregnancy negative on 06/30/2013  SpO2 97%   BMI 37.63 kg/m   General Appearance:    Alert, cooperative, no distress, appears stated age.  overweight  Head:    Normocephalic, without obvious abnormality, atraumatic  Eyes:    PERRL, conjunctiva/corneas clear, EOM's intact, fundi    benign, both eyes  Ears:    Normal TM's and external ear canals, both ears  Nose:   Nares normal, septum midline, mucosa normal, no drainage    or sinus tenderness  Throat:   Lips, mucosa, and tongue normal; teeth and gums normal  Neck:   Supple, symmetrical, trachea midline, no adenopathy;    thyroid:  no enlargement/tenderness/nodules; no carotid   bruit or JVD  Back:     Symmetric, no curvature, ROM normal, no CVA tenderness  Lungs:     Clear to auscultation bilaterally, respirations unlabored  Chest Wall:    No tenderness or deformity   Heart:    Regular rate and rhythm, S1 and S2 normal, no murmur, rub   or gallop  Breast Exam:    No tenderness, masses, or nipple abnormality  Abdomen:     Soft, non-tender, bowel sounds active all four quadrants,  no masses, no organomegaly  Extremities:   Extremities normal, atraumatic, no cyanosis or edema  Pulses:   2+ and symmetric all extremities  Skin:   Skin color, texture, turgor normal, no rashes or lesions  Lymph nodes:   Cervical, supraclavicular, and axillary nodes normal  Neurologic:   CNII-XII intact, normal strength, sensation and reflexes    throughout    ASSESSMENT/PLAN:  1. Elevated blood-pressure reading without diagnosis of hypertension Discussed weight loss, diet, salt, exericise  2. Class 2 obesity due to excess calories without serious comorbidity  with body mass index (BMI) of 35.0 to 35.9 in adult  3. Physical exam, annual  4. Screening for breast cancer - MM Digital Screening; Future  5. Needs flu shot - Flu Vaccine QUAD 36+ mos IM   Patient Instructions  See me in 2-3 months   Hypertension Hypertension, commonly called high blood pressure, is when the force of blood pumping through the arteries is too strong. The arteries are the blood vessels that carry blood from the heart throughout the body. Hypertension forces the heart to work harder to pump blood and may cause arteries to become narrow or stiff. Having untreated or uncontrolled hypertension can cause heart attacks, strokes, kidney disease, and other problems. A blood pressure reading consists of a higher number over a lower number. Ideally, your blood pressure should be below 120/80. The first ("top") number is called the systolic pressure. It is a measure of the pressure in your arteries as your heart beats. The second ("bottom") number is called the diastolic pressure. It is a measure of the pressure in your arteries as the heart relaxes. What are the causes? The cause of this condition is not known. What increases the risk? Some risk factors for high blood pressure are under your control. Others are not. Factors you can change  Smoking.  Having type 2 diabetes mellitus, high cholesterol, or both.  Not getting enough exercise or physical activity.  Being overweight.  Having too much fat, sugar, calories, or salt (sodium) in your diet.  Drinking too much alcohol. Factors that are difficult or impossible to change  Having chronic kidney disease.  Having a family history of high blood pressure.  Age. Risk increases with age.  Race. You may be at higher risk if you are African-American.  Gender. Men are at higher risk than women before age 52. After age 48, women are at higher risk than men.  Having obstructive sleep apnea.  Stress. What are the signs  or symptoms? Extremely high blood pressure (hypertensive crisis) may cause:  Headache.  Anxiety.  Shortness of breath.  Nosebleed.  Nausea and vomiting.  Severe chest pain.  Jerky movements you cannot control (seizures).  How is this diagnosed? This condition is diagnosed by measuring your blood pressure while you are seated, with your arm resting on a surface. The cuff of the blood pressure monitor will be placed directly against the skin of your upper arm at the level of your heart. It should be measured at least twice using the same arm. Certain conditions can cause a difference in blood pressure between your right and left arms. Certain factors can cause blood pressure readings to be lower or higher than normal (elevated) for a short period of time:  When your blood pressure is higher when you are in a health care provider's office than when you are at home, this is called white coat hypertension. Most people with this condition do not need medicines.  When your blood pressure is higher at home than when you are in a health care provider's office, this is called masked hypertension. Most people with this condition may need medicines to control blood pressure.  If you have a high blood pressure reading during one visit or you have normal blood pressure with other risk factors:  You may be asked to return on a different day to have your blood pressure checked again.  You may be asked to monitor your blood pressure at home for 1 week or longer.  If you are diagnosed with hypertension, you may have other blood or imaging tests to help your health care provider understand your overall risk for other conditions. How is this treated? This condition is treated by making healthy lifestyle changes, such as eating healthy foods, exercising more, and reducing your alcohol intake. Your health care provider may prescribe medicine if lifestyle changes are not enough to get your blood pressure  under control, and if:  Your systolic blood pressure is above 130.  Your diastolic blood pressure is above 80.  Your personal target blood pressure may vary depending on your medical conditions, your age, and other factors. Follow these instructions at home: Eating and drinking  Eat a diet that is high in fiber and potassium, and low in sodium, added sugar, and fat. An example eating plan is called the DASH (Dietary Approaches to Stop Hypertension) diet. To eat this way: ? Eat plenty of fresh fruits and vegetables. Try to fill half of your plate at each meal with fruits and vegetables. ? Eat whole grains, such as whole wheat pasta, brown rice, or whole grain bread. Fill about one quarter of your plate with whole grains. ? Eat or drink low-fat dairy products, such as skim milk or low-fat yogurt. ? Avoid fatty cuts of meat, processed or cured meats, and poultry with skin. Fill about one quarter of your plate with lean proteins, such as fish, chicken without skin, beans, eggs, and tofu. ? Avoid premade and processed foods. These tend to be higher in sodium, added sugar, and fat.  Reduce your daily sodium intake. Most people with hypertension should eat less than 1,500 mg of sodium a day.  Limit alcohol intake to no more than 1 drink a day for nonpregnant women and 2 drinks a day for men. One drink equals 12 oz of beer, 5 oz of wine, or 1 oz of hard liquor. Lifestyle  Work with your health care provider to maintain a healthy body weight or to lose weight. Ask what an ideal weight is for you.  Get at least 30 minutes of exercise that causes your heart to beat faster (aerobic exercise) most days of the week. Activities may include walking, swimming, or biking.  Include exercise to strengthen your muscles (resistance exercise), such as pilates or lifting weights, as part of your weekly exercise routine. Try to do these types of exercises for 30 minutes at least 3 days a week.  Do not use any  products that contain nicotine or tobacco, such as cigarettes and e-cigarettes. If you need help quitting, ask your health care provider.  Monitor your blood pressure at home as told by your health care provider.  Keep all follow-up visits as told by your health care provider. This is important. Medicines  Take over-the-counter and prescription medicines only as told by your health care provider. Follow directions carefully. Blood pressure medicines must be taken as prescribed.  Do not skip doses of blood  pressure medicine. Doing this puts you at risk for problems and can make the medicine less effective.  Ask your health care provider about side effects or reactions to medicines that you should watch for. Contact a health care provider if:  You think you are having a reaction to a medicine you are taking.  You have headaches that keep coming back (recurring).  You feel dizzy.  You have swelling in your ankles.  You have trouble with your vision. Get help right away if:  You develop a severe headache or confusion.  You have unusual weakness or numbness.  You feel faint.  You have severe pain in your chest or abdomen.  You vomit repeatedly.  You have trouble breathing. Summary  Hypertension is when the force of blood pumping through your arteries is too strong. If this condition is not controlled, it may put you at risk for serious complications.  Your personal target blood pressure may vary depending on your medical conditions, your age, and other factors. For most people, a normal blood pressure is less than 120/80.  Hypertension is treated with lifestyle changes, medicines, or a combination of both. Lifestyle changes include weight loss, eating a healthy, low-sodium diet, exercising more, and limiting alcohol. This information is not intended to replace advice given to you by your health care provider. Make sure you discuss any questions you have with your health care  provider. Document Released: 04/14/2005 Document Revised: 03/12/2016 Document Reviewed: 03/12/2016 Elsevier Interactive Patient Education  2018 [employer].    Raylene Everts, MD

## 2017-01-28 ENCOUNTER — Other Ambulatory Visit: Payer: Self-pay | Admitting: Family Medicine

## 2017-01-28 DIAGNOSIS — Z1231 Encounter for screening mammogram for malignant neoplasm of breast: Secondary | ICD-10-CM

## 2017-02-01 ENCOUNTER — Emergency Department (HOSPITAL_COMMUNITY): Payer: BC Managed Care – PPO

## 2017-02-01 ENCOUNTER — Emergency Department (HOSPITAL_COMMUNITY)
Admission: EM | Admit: 2017-02-01 | Discharge: 2017-02-01 | Disposition: A | Discharge status: Home / Self Care | Payer: BC Managed Care – PPO | Attending: Emergency Medicine | Admitting: Emergency Medicine

## 2017-02-01 ENCOUNTER — Encounter (HOSPITAL_COMMUNITY): Payer: Self-pay

## 2017-02-01 DIAGNOSIS — R42 Dizziness and giddiness: Secondary | ICD-10-CM | POA: Insufficient documentation

## 2017-02-01 DIAGNOSIS — M25512 Pain in left shoulder: Secondary | ICD-10-CM | POA: Insufficient documentation

## 2017-02-01 DIAGNOSIS — R202 Paresthesia of skin: Secondary | ICD-10-CM | POA: Diagnosis not present

## 2017-02-01 DIAGNOSIS — R110 Nausea: Secondary | ICD-10-CM | POA: Diagnosis not present

## 2017-02-01 DIAGNOSIS — R072 Precordial pain: Secondary | ICD-10-CM | POA: Insufficient documentation

## 2017-02-01 LAB — CBC
HCT: 38.8 % (ref 36.0–46.0)
HEMOGLOBIN: 13.5 g/dL (ref 12.0–15.0)
MCH: 30.6 pg (ref 26.0–34.0)
MCHC: 34.8 g/dL (ref 30.0–36.0)
MCV: 88 fL (ref 78.0–100.0)
PLATELETS: 235 10*3/uL (ref 150–400)
RBC: 4.41 MIL/uL (ref 3.87–5.11)
RDW: 12.7 % (ref 11.5–15.5)
WBC: 5.4 10*3/uL (ref 4.0–10.5)

## 2017-02-01 LAB — BASIC METABOLIC PANEL
ANION GAP: 8 (ref 5–15)
BUN: 12 mg/dL (ref 6–20)
CALCIUM: 9.1 mg/dL (ref 8.9–10.3)
CO2: 26 mmol/L (ref 22–32)
Chloride: 105 mmol/L (ref 101–111)
Creatinine, Ser: 0.62 mg/dL (ref 0.44–1.00)
GLUCOSE: 131 mg/dL — AB (ref 65–99)
Potassium: 3.6 mmol/L (ref 3.5–5.1)
Sodium: 139 mmol/L (ref 135–145)

## 2017-02-01 LAB — TROPONIN I: Troponin I: <0.03 ng/mL (ref <0.03)

## 2017-02-01 LAB — D-DIMER, QUANTITATIVE (NOT AT ARMC)

## 2017-02-01 MED ORDER — ONDANSETRON HCL 4 MG/2ML IJ SOLN
4.0000 mg | Freq: Once | INTRAMUSCULAR | Status: AC
  Administered 2017-02-01: 4 mg via INTRAVENOUS
  Filled 2017-02-01: qty 2

## 2017-02-01 MED ORDER — IBUPROFEN 800 MG PO TABS: 800.0000 mg | ORAL_TABLET | Freq: Three times a day (TID) | ORAL | 0 refills | Status: DC | PRN

## 2017-02-01 MED ORDER — SODIUM CHLORIDE 0.9 % IV BOLUS (SEPSIS)
500.0000 mL | Freq: Once | INTRAVENOUS | Status: AC
  Administered 2017-02-01: 500 mL via INTRAVENOUS

## 2017-02-01 MED ORDER — ASPIRIN 81 MG PO CHEW
324.0000 mg | CHEWABLE_TABLET | Freq: Once | ORAL | Status: AC
  Administered 2017-02-01: 324 mg via ORAL
  Filled 2017-02-01: qty 4

## 2017-02-01 MED ORDER — ONDANSETRON 4 MG PO TBDP: 4.0000 mg | ORAL_TABLET | Freq: Three times a day (TID) | ORAL | 0 refills | Status: DC | PRN

## 2017-02-01 NOTE — Discharge Instructions (Signed)

## 2017-02-01 NOTE — ED Triage Notes (Signed)
Pt reports that she has had left sided chest pain for "awhile". Experiencing numbness in left arm. Head is hurting. Pt reports feeling dizzy and nausea today and that is what made her decide to come in today

## 2017-02-01 NOTE — ED Provider Notes (Signed)
Emergency Department Provider Note   I have reviewed the triage vital signs and the nursing notes.   HISTORY  Chief Complaint Chest Pain   HPI Nancy [name] is a 51 y.o. female with PMH of anxiety, depression, and GERD who presents to the emergency department for evaluation of left shoulder/chest pain with tingling sensation in the left fingers. Patient states that her symptoms have been intermittent over the past 4-5 weeks and seemed to be getting worse. The pain is not particularly exertional or pleuritic. She denies shortness of breath. Today she had more severe pain and also felt lightheaded with nausea. Patient states she developed significant sweating and this morning and some mild dyspnea. No fevers or chills. She was seen by her primary care physician recently she was found to have elevated blood pressure. She was not started on medication but instead has a follow-up appointment in 2 months after trial of diet and activity modification.   Pain in the chest/shoulder is "burning pressure" sensation. No neck pain. No weakness in the LUE to go along with tingling. No LLE symptoms.    Past Medical History:  Diagnosis Date  . Anxiety   . Depression   . GERD (gastroesophageal reflux disease)   . Irregular bleeding 03/10/2013    Patient Active Problem List   Diagnosis Date Noted  . GERD (gastroesophageal reflux disease) 02/21/2016  . Obesity 02/21/2016  . H/O: hysterectomy 01/23/2016  . Anxiety 03/10/2013    Past Surgical History:  Procedure Laterality Date  . ABDOMINAL HYSTERECTOMY     heavy irreg bleeding  . CHOLECYSTECTOMY    . COLONOSCOPY N/A 10/09/2016   Procedure: COLONOSCOPY;  Surgeon: Daneil Dolin, MD;  Location: [facility];  Service: Endoscopy;  Laterality: N/A;  9:30 AM  . DILATION AND CURETTAGE OF UTERUS    . ENDOMETRIAL ABLATION    . SALPINGOOPHORECTOMY Bilateral 07/06/2013   Procedure: SALPINGO OOPHORECTOMY;  Surgeon: Florian Buff, MD;  Location:  [facility];  Service: Gynecology;  Laterality: Bilateral;  . SUPRACERVICAL ABDOMINAL HYSTERECTOMY N/A 07/06/2013   Procedure: HYSTERECTOMY SUPRACERVICAL ABDOMINAL;  Surgeon: Florian Buff, MD;  Location: [facility];  Service: Gynecology;  Laterality: N/A;  . TUBAL LIGATION      Current Outpatient Rx  . Order #: [id number] Class: Normal  . Order #: [id number] Class: Normal  . Order #: [id number] Class: Print  . Order #: [id number] Class: Print  . Order #: [id number] Class: Normal    Allergies Patient has no known allergies.  Family History  Problem Relation Age of Onset  . COPD Mother   . Heart disease Mother        CHF  . Cancer Father        kidney met to lung  . Heart attack Father 85       died of heart attack  . Hypertension Maternal Grandmother   . Heart disease Maternal Grandmother   . Hypertension Maternal Grandfather   . Heart disease Maternal Grandfather   . Hypertension Paternal Grandmother   . Heart disease Paternal Grandmother   . Hypertension Paternal Grandfather   . Heart disease Paternal Grandfather   . Cancer Sister 69       lung,renal  . Alcohol abuse Sister     Social History Social History  Substance Use Topics  . Smoking status: Never Smoker  . Smokeless tobacco: Never Used  . Alcohol use No    Review of Systems  Constitutional: No fever/chills. Positive lightheadedness and diaphoresis.  Eyes: No  visual changes. ENT: No sore throat. Cardiovascular: Positive chest pain. Respiratory: Positive shortness of breath. Gastrointestinal: No abdominal pain.  No nausea, no vomiting.  No diarrhea.  No constipation. Genitourinary: Negative for dysuria. Musculoskeletal: Negative for back pain. Positive left shoulder pain with tingling in the left fingers/arm.  Skin: Negative for rash. Neurological: Negative for headaches, focal weakness or numbness.  10-point ROS otherwise negative.  ____________________________________________   PHYSICAL EXAM:  VITAL SIGNS: ED  Triage Vitals  Enc Vitals Group     BP 02/01/17 1255 (!) 175/104     Pulse Rate 02/01/17 1254 69     Resp 02/01/17 1254 18     Temp 02/01/17 1254 98.4 F (36.9 C)     Temp Source 02/01/17 1254 Oral     SpO2 02/01/17 1254 100 %     Weight 02/01/17 1254 233 lb (105.7 kg)     Height 02/01/17 1254 _0  (1.676 m)     Pain Score 02/01/17 1255 5   Constitutional: Alert and oriented. Well appearing and in no acute distress. Eyes: Conjunctivae are normal. Head: Atraumatic. Nose: No congestion/rhinnorhea. Mouth/Throat: Mucous membranes are moist.   Neck: No stridor. No cervical spine tenderness to palpation.  Cardiovascular: Normal rate, regular rhythm. Good peripheral circulation. Grossly normal heart sounds.   Respiratory: Normal respiratory effort.  No retractions. Lungs CTAB. Gastrointestinal: Soft and nontender. No distention.  Musculoskeletal: No lower extremity tenderness nor edema. No gross deformities of extremities. Normal ROM of the left shoulder with no tenderness to palpation.  Neurologic:  Normal speech and language. Normal upper and lower extremity strength. Decreased sensation to light touch over the left forearm and hand.  Skin:  Skin is warm, dry and intact. No rash noted.  ____________________________________________   LABS (all labs ordered are listed, but only abnormal results are displayed)  Labs Reviewed  BASIC METABOLIC PANEL - Abnormal; Notable for the following:       Result Value   Glucose, Bld 131 (*)    All other components within normal limits  CBC  TROPONIN I  D-DIMER, QUANTITATIVE (NOT AT [facility])  TROPONIN I   ____________________________________________  EKG   EKG Interpretation  Date/Time:  Sunday February 01 2017 12:58:16 EDT Ventricular Rate:  70 PR Interval:  166 QRS Duration: 92 QT Interval:  410 QTC Calculation: 442 R Axis:   13 Text Interpretation:  Normal sinus rhythm Normal ECG No STEMI.  Confirmed by Nanda Quinton [phone]) on  02/01/2017 1:09:10 PM       ____________________________________________  RADIOLOGY  Dg Chest 2 View  Result Date: 02/01/2017 CLINICAL DATA:  51 year old female with chest pain, headache and numbness in the left arm and fingers EXAM: CHEST  2 VIEW COMPARISON:  Prior chest x-ray 06/05/2011 FINDINGS: The lungs are clear and negative for focal airspace consolidation, pulmonary edema or suspicious pulmonary nodule. No pleural effusion or pneumothorax. Cardiac and mediastinal contours are within normal limits. No acute fracture or lytic or blastic osseous lesions. The visualized upper abdominal bowel gas pattern is unremarkable. Surgical clips in the right upper quadrant suggest prior cholecystectomy. IMPRESSION: Negative chest x-ray. Electronically Signed   By: Jacqulynn Cadet M.D.   On: 02/01/2017 13:40    ____________________________________________   PROCEDURES  Procedure(s) performed:   Procedures  None ____________________________________________   INITIAL IMPRESSION / ASSESSMENT AND PLAN / ED COURSE  Pertinent labs & imaging results that were available during my care of the patient were reviewed by me and considered in my medical decision  making (see chart for details).  Patient presents to the emergency department for evaluation of left chest and shoulder pain with associated lightheadedness, nausea, diaphoresis this morning. Patient's left chest and shoulder symptoms have been intermittent over the past month but worsening over the past several days. Symptoms are not worse with exertion. Pain not pleuritic. Patient is largely unremarkable exam. Lower suspicion for MSk etiology of pain. Patient has recently diagnosed hypertension but not on medication. Also has strong family history of coronary artery disease. Low pretest probability for PE by [facility] but unable to apply PERC rule with age 56. Will add d-dimer with worsening symptoms and dyspnea this AM. HEART score 3 (low risk).    03:15 PM Patient is feeling much better. Labs and imaging are normal. Plan for repeat troponin and if negative would plan for PCP and Cardiology f/u for stress testing as an outpatient.   At this time, I do not feel there is any life-threatening condition present. I have reviewed and discussed all results (EKG, imaging, lab, urine as appropriate), exam findings with patient. I have reviewed nursing notes and appropriate previous records.  I feel the patient is safe to be discharged home without further emergent workup. Discussed usual and customary return precautions. Patient and family (if present) verbalize understanding and are comfortable with this plan.  Patient will follow-up with their primary care provider. If they do not have a primary care provider, information for follow-up has been provided to them. All questions have been answered.  ____________________________________________  FINAL CLINICAL IMPRESSION(S) / ED DIAGNOSES  Final diagnoses:  Precordial chest pain  Nausea     MEDICATIONS GIVEN DURING THIS VISIT:  Medications  ondansetron (ZOFRAN) injection 4 mg (4 mg Intravenous Given 02/01/17 1403)  sodium chloride 0.9 % bolus 500 mL (0 mLs Intravenous Stopped 02/01/17 1452)  aspirin chewable tablet 324 mg (324 mg Oral Given 02/01/17 1403)     NEW OUTPATIENT MEDICATIONS STARTED DURING THIS VISIT:  Discharge Medication List as of 02/01/2017  5:03 PM    START taking these medications   Details  ibuprofen (ADVIL,MOTRIN) 800 MG tablet Take 1 tablet (800 mg total) by mouth every 8 (eight) hours as needed., Starting Sun 02/01/2017, Print    ondansetron (ZOFRAN ODT) 4 MG disintegrating tablet Take 1 tablet (4 mg total) by mouth every 8 (eight) hours as needed for nausea or vomiting., Starting Sun 02/01/2017, Print        Note:  This document was prepared using Dragon voice recognition software and may include unintentional dictation errors.  Nanda Quinton, MD Emergency  Medicine    Freeland Pracht, [facility], MD 02/01/17 2200

## 2017-02-02 ENCOUNTER — Ambulatory Visit (HOSPITAL_COMMUNITY)
Admission: RE | Admit: 2017-02-02 | Discharge: 2017-02-02 | Disposition: A | Discharge status: Home / Self Care | Payer: BC Managed Care – PPO | Source: Ambulatory Visit | Attending: Family Medicine | Admitting: Family Medicine

## 2017-02-02 DIAGNOSIS — Z1231 Encounter for screening mammogram for malignant neoplasm of breast: Secondary | ICD-10-CM | POA: Insufficient documentation

## 2017-03-04 ENCOUNTER — Other Ambulatory Visit: Payer: Self-pay | Admitting: Family Medicine

## 2017-03-05 NOTE — Telephone Encounter (Signed)
Seen 10 2 18

## 2017-04-03 ENCOUNTER — Ambulatory Visit: Payer: BC Managed Care – PPO | Admitting: Family Medicine

## 2017-04-07 ENCOUNTER — Telehealth: Payer: Self-pay

## 2017-04-07 NOTE — Telephone Encounter (Signed)
Spoke to [place], has been having lower back pain x 1 week, appt made for 1120 tomorrow.

## 2017-04-07 NOTE — Telephone Encounter (Signed)
Left message on voicemail requesting an appt today to be seen for pain

## 2017-04-08 ENCOUNTER — Ambulatory Visit: Payer: BC Managed Care – PPO | Admitting: Family Medicine

## 2017-04-08 ENCOUNTER — Encounter: Payer: Self-pay | Admitting: Family Medicine

## 2017-04-08 ENCOUNTER — Other Ambulatory Visit: Payer: Self-pay

## 2017-04-08 VITALS — BP 146/94 | HR 84 | Temp 97.6°F | Resp 18 | Ht 66.0 in | Wt 228.0 lb

## 2017-04-08 DIAGNOSIS — M5416 Radiculopathy, lumbar region: Secondary | ICD-10-CM | POA: Diagnosis not present

## 2017-04-08 MED ORDER — METHYLPREDNISOLONE 4 MG PO TBPK: ORAL_TABLET | ORAL | 0 refills | Status: DC

## 2017-04-08 MED ORDER — HYDROCODONE-ACETAMINOPHEN 7.5-325 MG PO TABS: 1.0000 | ORAL_TABLET | Freq: Four times a day (QID) | ORAL | 0 refills | Status: DC | PRN

## 2017-04-08 NOTE — Patient Instructions (Signed)

## 2017-04-08 NOTE — Progress Notes (Signed)
Chief Complaint  Patient presents with  . Back Pain    since friday   Patient is here for an acute appointment.  She is been having back pain since Friday, December 7.    Awoke with pain in L low back radiating into the L buttock. It became severely painful the next day.  It then radiated down the outer leg to the knee, her knee area is numb. No weakness, no instability, no fall or injury, no heavy lifting.   No history of back problems, no prior x rays Has been using ice and heat and taking aleve for pain And is having trouble sleeping at night due to pain  Patient Active Problem List   Diagnosis Date Noted  . GERD (gastroesophageal reflux disease) 02/21/2016  . Obesity 02/21/2016  . H/O: hysterectomy 01/23/2016  . Anxiety 03/10/2013    Outpatient Encounter Medications as of 04/08/2017  Medication Sig  . FLUoxetine (PROZAC) 20 MG tablet TAKE ONE TABLET BY MOUTH ONCE DAILY.  Marland Kitchen ibuprofen (ADVIL,MOTRIN) 800 MG tablet Take 1 tablet (800 mg total) by mouth every 8 (eight) hours as needed.  Marland Kitchen omeprazole (PRILOSEC) 20 MG capsule Take 1 capsule (20 mg total) by mouth daily. (Patient taking differently: Take 20 mg by mouth daily as needed (heartburn). )  . ondansetron (ZOFRAN ODT) 4 MG disintegrating tablet Take 1 tablet (4 mg total) by mouth every 8 (eight) hours as needed for nausea or vomiting.  . polyethylene glycol-electrolytes (TRILYTE) 420 g solution Take 4,000 mLs by mouth as directed.  Marland Kitchen HYDROcodone-acetaminophen (NORCO) 7.5-325 MG tablet Take 1 tablet by mouth every 6 (six) hours as needed for moderate pain.  . methylPREDNISolone (MEDROL DOSEPAK) 4 MG TBPK tablet tad   No facility-administered encounter medications on file as of 04/08/2017.    No Known Allergies  Review of Systems  Constitutional: Positive for activity change. Negative for appetite change, chills, fever and unexpected weight change.       Reduced movement  Gastrointestinal: Negative for constipation and  diarrhea.  Genitourinary: Negative for difficulty urinating and flank pain.  Musculoskeletal: Positive for back pain and gait problem.  Neurological: Positive for numbness.  All other systems reviewed and are negative.   BP (!) 146/94 (BP Location: Right Arm, Patient Position: Sitting, Cuff Size: Normal)   Pulse 84   Temp 97.6 F (36.4 C) (Temporal)   Resp 18   Ht 5\' 6"  (1.676 m)   Wt 228 lb (103.4 kg)   LMP 07/06/2013 Comment: serum pregnancy negative on 06/30/2013  SpO2 97%   BMI 36.80 kg/m     ASSESSMENT/PLAN:  1. Acute left lumbar radiculopathy    Patient Instructions  Back Exercises If you have pain in your back, do these exercises 2-3 times each day or as told by your doctor. When the pain goes away, do the exercises once each day, but repeat the steps more times for each exercise (do more repetitions). If you do not have pain in your back, do these exercises once each day or as told by your doctor. Exercises Single Knee to Chest  Do these steps 3-5 times in a row for each leg: 1. Lie on your back on a firm bed or the floor with your legs stretched out. 2. Bring one knee to your chest. 3. Hold your knee to your chest by grabbing your knee or thigh. 4. Pull on your knee until you feel a gentle stretch in your lower back. 5. Keep doing the  stretch for 10-30 seconds. 6. Slowly let go of your leg and straighten it.  Pelvic Tilt  Do these steps 5-10 times in a row: 1. Lie on your back on a firm bed or the floor with your legs stretched out. 2. Bend your knees so they point up to the ceiling. Your feet should be flat on the floor. 3. Tighten your lower belly (abdomen) muscles to press your lower back against the floor. This will make your tailbone point up to the ceiling instead of pointing down to your feet or the floor. 4. Stay in this position for 5-10 seconds while you gently tighten your muscles and breathe evenly.  Cat-Cow  Do these steps until your lower back  bends more easily: 1. Get on your hands and knees on a firm surface. Keep your hands under your shoulders, and keep your knees under your hips. You may put padding under your knees. 2. Let your head hang down, and make your tailbone point down to the floor so your lower back is round like the back of a cat. 3. Stay in this position for 5 seconds. 4. Slowly lift your head and make your tailbone point up to the ceiling so your back hangs low (sags) like the back of a cow. 5. Stay in this position for 5 seconds.  Press-Ups  Do these steps 5-10 times in a row: 1. Lie on your belly (face-down) on the floor. 2. Place your hands near your head, about shoulder-width apart. 3. While you keep your back relaxed and keep your hips on the floor, slowly straighten your arms to raise the top half of your body and lift your shoulders. Do not use your back muscles. To make yourself more comfortable, you may change where you place your hands. 4. Stay in this position for 5 seconds. 5. Slowly return to lying flat on the floor.  Bridges  Do these steps 10 times in a row: 1. Lie on your back on a firm surface. 2. Bend your knees so they point up to the ceiling. Your feet should be flat on the floor. 3. Tighten your butt muscles and lift your butt off of the floor until your waist is almost as high as your knees. If you do not feel the muscles working in your butt and the back of your thighs, slide your feet 1-2 inches farther away from your butt. 4. Stay in this position for 3-5 seconds. 5. Slowly lower your butt to the floor, and let your butt muscles relax.  If this exercise is too easy, try doing it with your arms crossed over your chest. Belly Crunches  Do these steps 5-10 times in a row: 1. Lie on your back on a firm bed or the floor with your legs stretched out. 2. Bend your knees so they point up to the ceiling. Your feet should be flat on the floor. 3. Cross your arms over your chest. 4. Tip your  chin a little bit toward your chest but do not bend your neck. 5. Tighten your belly muscles and slowly raise your chest just enough to lift your shoulder blades a tiny bit off of the floor. 6. Slowly lower your chest and your head to the floor.  Back Lifts Do these steps 5-10 times in a row: 1. Lie on your belly (face-down) with your arms at your sides, and rest your forehead on the floor. 2. Tighten the muscles in your legs and your butt. 3. Slowly  lift your chest off of the floor while you keep your hips on the floor. Keep the back of your head in line with the curve in your back. Look at the floor while you do this. 4. Stay in this position for 3-5 seconds. 5. Slowly lower your chest and your face to the floor.  Contact a doctor if:  Your back pain gets a lot worse when you do an exercise.  Your back pain does not lessen 2 hours after you exercise. If you have any of these problems, stop doing the exercises. Do not do them again unless your doctor says it is okay. Get help right away if:  You have sudden, very bad back pain. If this happens, stop doing the exercises. Do not do them again unless your doctor says it is okay. This information is not intended to replace advice given to you by your health care provider. Make sure you discuss any questions you have with your health care provider. Document Released: 05/17/2010 Document Revised: 09/20/2015 Document Reviewed: 06/08/2014 Elsevier Interactive Patient Education  2018 [employer]    Raylene Everts, MD

## 2017-04-10 ENCOUNTER — Ambulatory Visit: Payer: BC Managed Care – PPO | Admitting: Family Medicine

## 2017-05-01 ENCOUNTER — Encounter: Payer: Self-pay | Admitting: Family Medicine

## 2017-05-01 ENCOUNTER — Other Ambulatory Visit: Payer: Self-pay

## 2017-05-01 ENCOUNTER — Ambulatory Visit: Payer: BC Managed Care – PPO | Admitting: Family Medicine

## 2017-05-01 VITALS — BP 156/100 | HR 88 | Temp 97.6°F | Resp 16 | Ht 66.0 in | Wt 232.0 lb

## 2017-05-01 DIAGNOSIS — M25562 Pain in left knee: Secondary | ICD-10-CM

## 2017-05-01 DIAGNOSIS — E785 Hyperlipidemia, unspecified: Secondary | ICD-10-CM | POA: Diagnosis not present

## 2017-05-01 DIAGNOSIS — I10 Essential (primary) hypertension: Secondary | ICD-10-CM | POA: Diagnosis not present

## 2017-05-01 HISTORY — DX: Essential (primary) hypertension: I10

## 2017-05-01 HISTORY — DX: Hyperlipidemia, unspecified: E78.5

## 2017-05-01 MED ORDER — HYDROCHLOROTHIAZIDE 25 MG PO TABS
25.0000 mg | ORAL_TABLET | Freq: Every day | ORAL | 3 refills | Status: DC
Start: 2017-05-01 — End: 2017-07-30

## 2017-05-01 NOTE — Patient Instructions (Signed)
Take the HCTZ daily Check BP at home Keep log of BP for two weeks See me in 3 months call sooner for problems Labs next time I am referring to ortho for the back and knee pain

## 2017-05-01 NOTE — Progress Notes (Signed)
Chief Complaint  Patient presents with  . Hypertension   Here for follow-up of her blood pressure.  Not followed through with diet and exercise.  She has gained weight.  She is ice cream every day.  It seems she lacks motivation for lifestyle changes to improve her health.  Her blood pressure is elevated.  Most of her blood pressures for the last year been elevated.  At this point I believe she needs to be on treatment for hypertension.  Discussed reasons for treatment, long-term untreated hypertension risk, different medicines available.  I am going to give her hydrochlorothiazide 25 mg a day and have her follow her blood pressure at home. She also continues to complain of pain in her left knee.  She does not know if the pain is from her knee her back.  She still has pain in her left SI region.  It comes and goes.  Is better with ice and worse with heat.  She took the Medrol Dosepak and pain medicine with no improvement.  She is try to do some stretching.  Since I am uncertain the etiology of her pain, I think an orthopedic referral is indicated. We discussed she has mild hyperlipidemia.  Her LDL is borderline.  Her risk of developing cardiovascular disease over the next 10 years is calculated at 6.6%.  She needs to reduce the cholesterol in her diet.  I gave her a cholesterol handout.  Patient Active Problem List   Diagnosis Date Noted  . GERD (gastroesophageal reflux disease) 02/21/2016  . Obesity 02/21/2016  . H/O: hysterectomy 01/23/2016  . Anxiety 03/10/2013    Outpatient Encounter Medications as of 05/01/2017  Medication Sig  . FLUoxetine (PROZAC) 20 MG tablet TAKE ONE TABLET BY MOUTH ONCE DAILY.  Marland Kitchen ibuprofen (ADVIL,MOTRIN) 800 MG tablet Take 1 tablet (800 mg total) by mouth every 8 (eight) hours as needed.  Marland Kitchen omeprazole (PRILOSEC) 20 MG capsule Take 1 capsule (20 mg total) by mouth daily. (Patient taking differently: Take 20 mg by mouth daily as needed (heartburn). )  .  hydrochlorothiazide (HYDRODIURIL) 25 MG tablet Take 1 tablet (25 mg total) by mouth daily.   No facility-administered encounter medications on file as of 05/01/2017.     No Known Allergies  Review of Systems  Constitutional: Negative for activity change, appetite change and unexpected weight change.  HENT: Negative for congestion, dental problem, postnasal drip and rhinorrhea.   Eyes: Negative for redness and visual disturbance.  Respiratory: Negative for cough and shortness of breath.   Cardiovascular: Negative for chest pain, palpitations and leg swelling.  Gastrointestinal: Negative for abdominal pain, constipation and diarrhea.  Genitourinary: Negative for difficulty urinating, frequency, vaginal bleeding and vaginal discharge.  Musculoskeletal: Positive for arthralgias and back pain.       Pain in left SI, pain in left knee  Neurological: Negative for dizziness and headaches.  Psychiatric/Behavioral: Negative for dysphoric mood and sleep disturbance. The patient is not nervous/anxious.    BP (!) 156/100 (BP Location: Right Arm, Patient Position: Sitting, Cuff Size: Large)   Pulse 88   Temp 97.6 F (36.4 C) (Temporal)   Resp 16   Ht 5\' 6"  (1.676 m)   Wt 232 lb (105.2 kg)   LMP 07/06/2013 Comment: serum pregnancy negative on 06/30/2013  SpO2 98%   BMI 37.45 kg/m   Physical Exam  Constitutional: She is oriented to person, place, and time. She appears well-developed and well-nourished.  Pleasant. Smiling. moderately overweight  HENT:  Head: Normocephalic and atraumatic.  Right Ear: External ear normal.  Left Ear: External ear normal.  Mouth/Throat: Oropharynx is clear and moist.  Eyes: Conjunctivae are normal. Pupils are equal, round, and reactive to light.  Neck: Normal range of motion. Neck supple. No thyromegaly present.  Cardiovascular: Normal rate, regular rhythm and normal heart sounds.  Pulmonary/Chest: Effort normal and breath sounds normal. No respiratory distress.    Abdominal: Soft. Bowel sounds are normal.  Musculoskeletal: Normal range of motion. She exhibits no edema.  Back straight and nontender.  Good range of motion.  Strength sensation range of motion reflexes appears normal in both lower extremities.  Straight leg raise negative  Lymphadenopathy:    She has no cervical adenopathy.  Neurological: She is alert and oriented to person, place, and time.  Skin: Skin is warm and dry.  Psychiatric: She has a normal mood and affect. Her behavior is normal. Thought content normal.  Nursing note and vitals reviewed.   ASSESSMENT/PLAN:  1. Essential hypertension  2. Left knee pain, unspecified chronicity - Ambulatory referral to Orthopedic Surgery 3.  Hyperlipidemia Discussed.  Diet exercise lifestyle changes recommended.  Patient Instructions  Take the HCTZ daily Check BP at home Keep log of BP for two weeks See me in 3 months call sooner for problems Labs next time I am referring to ortho for the back and knee pain    Raylene Everts, MD

## 2017-05-07 ENCOUNTER — Encounter: Payer: Self-pay | Admitting: Family Medicine

## 2017-05-13 ENCOUNTER — Ambulatory Visit (INDEPENDENT_AMBULATORY_CARE_PROVIDER_SITE_OTHER): Payer: BC Managed Care – PPO

## 2017-05-13 ENCOUNTER — Ambulatory Visit: Payer: BC Managed Care – PPO | Admitting: Orthopaedic Surgery

## 2017-05-13 ENCOUNTER — Other Ambulatory Visit: Payer: Self-pay | Admitting: Orthopaedic Surgery

## 2017-05-13 ENCOUNTER — Encounter: Payer: Self-pay | Admitting: Orthopaedic Surgery

## 2017-05-13 VITALS — BP 147/102 | HR 92 | Ht 67.0 in | Wt 227.0 lb

## 2017-05-13 DIAGNOSIS — M5442 Lumbago with sciatica, left side: Principal | ICD-10-CM

## 2017-05-13 DIAGNOSIS — G8929 Other chronic pain: Secondary | ICD-10-CM

## 2017-05-13 MED ORDER — DICLOFENAC SODIUM 75 MG PO TBEC: 75.0000 mg | DELAYED_RELEASE_TABLET | Freq: Two times a day (BID) | ORAL | 2 refills | Status: DC

## 2017-05-13 NOTE — Progress Notes (Signed)
Subjective:    Patient ID: Nancy [name], female    DOB: 1966-03-18, 52 y.o.   MRN: [id number]  HPI She began; having lower back pain with radiation to the left thigh and below the knee to the lateral left foot around December 7th.  She has been followed by her family doctor for this and has had prednisone with only slight help.  She says her leg gets numb at times on the left but she has no weakness. She has tried heat, ice, rest, rubs, Tylenol with little help. She has no trauma.Her pain is sharp, throbbing and shooting and deep.  It is worse in the afternoon and night.   Review of Systems  HENT: Negative for congestion.   Respiratory: Negative for cough and shortness of breath.   Cardiovascular: Negative for chest pain and leg swelling.  Musculoskeletal: Positive for arthralgias, back pain and gait problem.  All other systems reviewed and are negative.  Past Medical History:  Diagnosis Date  . Anxiety   . Depression   . Essential hypertension 05/01/2017  . GERD (gastroesophageal reflux disease)   . Hyperlipidemia 05/01/2017  . Hypertension   . Irregular bleeding 03/10/2013    Past Surgical History:  Procedure Laterality Date  . ABDOMINAL HYSTERECTOMY     heavy irreg bleeding  . CHOLECYSTECTOMY    . COLONOSCOPY N/A 10/09/2016   Procedure: COLONOSCOPY;  Surgeon: Daneil Dolin, MD;  Location: [facility];  Service: Endoscopy;  Laterality: N/A;  9:30 AM  . DILATION AND CURETTAGE OF UTERUS    . ENDOMETRIAL ABLATION    . SALPINGOOPHORECTOMY Bilateral 07/06/2013   Procedure: SALPINGO OOPHORECTOMY;  Surgeon: Florian Buff, MD;  Location: [facility];  Service: Gynecology;  Laterality: Bilateral;  . SUPRACERVICAL ABDOMINAL HYSTERECTOMY N/A 07/06/2013   Procedure: HYSTERECTOMY SUPRACERVICAL ABDOMINAL;  Surgeon: Florian Buff, MD;  Location: [facility];  Service: Gynecology;  Laterality: N/A;  . TUBAL LIGATION      Current Outpatient Medications on File Prior to Visit  Medication Sig  Dispense Refill  . FLUoxetine (PROZAC) 20 MG tablet TAKE ONE TABLET BY MOUTH ONCE DAILY. 90 tablet 0  . hydrochlorothiazide (HYDRODIURIL) 25 MG tablet Take 1 tablet (25 mg total) by mouth daily. 90 tablet 3  . ibuprofen (ADVIL,MOTRIN) 800 MG tablet Take 1 tablet (800 mg total) by mouth every 8 (eight) hours as needed. 21 tablet 0  . omeprazole (PRILOSEC) 20 MG capsule Take 1 capsule (20 mg total) by mouth daily. (Patient taking differently: Take 20 mg by mouth daily as needed (heartburn). ) 90 capsule 3   No current facility-administered medications on file prior to visit.     Social History   Socioeconomic History  . Marital status: Divorced    Spouse name: Not on file  . Number of children: 2  . Years of education: 8  . Highest education level: Not on file  Social Needs  . Financial resource strain: Not on file  . Food insecurity - worry: Not on file  . Food insecurity - inability: Not on file  . Transportation needs - medical: Not on file  . Transportation needs - non-medical: Not on file  Occupational History  . Occupation: Product manager: [employer]    Comment: 4th grade  Tobacco Use  . Smoking status: Never Smoker  . Smokeless tobacco: Never Used  Substance and Sexual Activity  . Alcohol use: No  . Drug use: No  . Sexual activity: Yes  Birth control/protection: Surgical  Other Topics Concern  . Not on file  Social History Narrative   Lives at home with fiancee   Daughter 60 has 4 children   Daughter 98 lives in [place]    Family History  Problem Relation Age of Onset  . COPD Mother   . Heart disease Mother        CHF  . Hypertension Mother   . Cancer Father        kidney met to lung  . Heart attack Father 39       died of heart attack  . Hypertension Maternal Grandmother   . Heart disease Maternal Grandmother   . Rheum arthritis Maternal Grandmother   . Hypertension Maternal Grandfather   . Heart disease Maternal Grandfather   .  Hypertension Paternal Grandmother   . Heart disease Paternal Grandmother   . Hypertension Paternal Grandfather   . Heart disease Paternal Grandfather   . Cancer Sister 89       lung,renal  . Alcohol abuse Sister     BP (!) 147/102   Pulse 92   Ht 5' 7" (1.702 m)   Wt 227 lb (103 kg)   LMP 07/06/2013 Comment: serum pregnancy negative on 06/30/2013  BMI 35.55 kg/m      Objective:   Physical Exam  Constitutional: She is oriented to person, place, and time. She appears well-developed and well-nourished.  HENT:  Head: Normocephalic and atraumatic.  Eyes: Conjunctivae and EOM are normal. Pupils are equal, round, and reactive to light.  Neck: Normal range of motion. Neck supple.  Cardiovascular: Normal rate, regular rhythm and intact distal pulses.  Pulmonary/Chest: Effort normal.  Abdominal: Soft.  Musculoskeletal: She exhibits tenderness (Lower back pain with no spasm, ROM is full, gait is normal, SLR negative, reflexes normal, NV intact.).  Neurological: She is alert and oriented to person, place, and time. She displays normal reflexes. No cranial nerve deficit. She exhibits normal muscle tone. Coordination normal.  Skin: Skin is warm and dry.  Psychiatric: She has a normal mood and affect. Her behavior is normal. Judgment and thought content normal.  Vitals reviewed.   X-rays of the lumbar spine were done, reported separately.      Assessment & Plan:   Encounter Diagnosis  Name Primary?  . Chronic midline low back pain with left-sided sciatica Yes   I will begin PT for her. It is scheduled.  Return in two weeks.  I will begin diclofenac one bid pc.  She may need MRI of the back if not improved.  Call if any problem.  Precautions discussed.   Electronically Signed Sanjuana Kava, MD 1/16/20198:52 AM

## 2017-05-13 NOTE — Patient Instructions (Signed)
  Physical therapy has been ordered for you at [facility] [phone] is the phone number to call if you want to call to schedule. Please let us know if you do not hear anything within one week.  

## 2017-05-18 ENCOUNTER — Ambulatory Visit (HOSPITAL_COMMUNITY): Payer: BC Managed Care – PPO | Attending: Orthopaedic Surgery

## 2017-05-18 ENCOUNTER — Other Ambulatory Visit: Payer: Self-pay

## 2017-05-18 ENCOUNTER — Encounter (HOSPITAL_COMMUNITY): Payer: Self-pay

## 2017-05-18 DIAGNOSIS — M5442 Lumbago with sciatica, left side: Secondary | ICD-10-CM | POA: Diagnosis present

## 2017-05-18 DIAGNOSIS — R29898 Other symptoms and signs involving the musculoskeletal system: Secondary | ICD-10-CM | POA: Insufficient documentation

## 2017-05-18 DIAGNOSIS — M6281 Muscle weakness (generalized): Secondary | ICD-10-CM

## 2017-05-18 NOTE — Therapy (Addendum)
[facility] [address] [place], [place], [address] Phone: [phone]   Fax:  [phone]  Physical Therapy Evaluation  Patient Details  Name: Nancy [name] MRN: [id number] Date of Birth: 1965-11-13 Referring Provider: Sanjuana Kava MD   Encounter Date: 05/18/2017  PT End of Session - 05/18/17 1734    Visit Number  1    Number of Visits  9    Date for PT Re-Evaluation  06/15/17    Authorization Type  [employer]    Authorization Time Period  05/18/17 - 2/18-19    PT Start Time  1647    PT Stop Time  1732    PT Time Calculation (min)  45 min    Activity Tolerance  Patient tolerated treatment well;No increased pain    Behavior During Therapy  WFL for tasks assessed/performed       Past Medical History:  Diagnosis Date  . Anxiety   . Depression   . Essential hypertension 05/01/2017  . GERD (gastroesophageal reflux disease)   . Hyperlipidemia 05/01/2017  . Hypertension   . Irregular bleeding 03/10/2013    Past Surgical History:  Procedure Laterality Date  . ABDOMINAL HYSTERECTOMY     heavy irreg bleeding  . CHOLECYSTECTOMY    . COLONOSCOPY N/A 10/09/2016   Procedure: COLONOSCOPY;  Surgeon: Daneil Dolin, MD;  [facility];  Service: Endoscopy;  Laterality: N/A;  9:30 AM  . DILATION AND CURETTAGE OF UTERUS    . ENDOMETRIAL ABLATION    . SALPINGOOPHORECTOMY Bilateral 07/06/2013   Procedure: SALPINGO OOPHORECTOMY;  Surgeon: Florian Buff, MD;  Location: [facility];  Service: Gynecology;  Laterality: Bilateral;  . SUPRACERVICAL ABDOMINAL HYSTERECTOMY N/A 07/06/2013   Procedure: HYSTERECTOMY SUPRACERVICAL ABDOMINAL;  Surgeon: Florian Buff, MD;  Location: [facility];  Service: Gynecology;  Laterality: N/A;  . TUBAL LIGATION      There were no vitals filed for this visit.   Subjective Assessment - 05/18/17 1651    Subjective  Patient reports she has been having low back and left leg pain since December 7th. She  reports it "started with a twinge and it just went from there". She denies any traumatic event or injury that could have initiated the onset of her pain and reports she woke up with it. She states she has never had PT before and has a follow up with Dr. Luna Glasgow next week. She denies any falls.  Ms. Sessa states she would like to start walking more and that she used to walk her dog but due to this new pain and weakness she has not been able too. Walking helps to alleviate her pain and she is in the greatest discomfort when sitting for prolonged amounts of time.    Limitations  Sitting;Standing;House hold activities stairs    How long can you sit comfortably?  30-45 minutes    How long can you stand comfortably?  unlimited    How long can you walk comfortably?  unlimited    Patient Stated Goals  I just want feel better    Currently in Pain?  Yes    Pain Score  5     Pain Location  Back    Pain Orientation  Right    Pain Descriptors / Indicators  Aching;Sharp;Shooting;Tightness;Numbness twisting in anterior thigh    Pain Radiating Towards  anterior thigh and knee down to anterior leg is numb, sharp jolts of electricity in knee, denies pain in  the posterior leg    Pain Frequency  Intermittent    Aggravating Factors   sitting, hot water (baths), driving    Pain Relieving Factors  walking, anti-inflammatory    Effect of Pain on Daily Activities  moderately    Multiple Pain Sites  No         [facility] PT Assessment - 05/18/17 0001      Assessment   Medical Diagnosis  Back Pain    Referring Provider  Sanjuana Kava MD    Onset Date/Surgical Date  04/03/17    Next MD Visit  05/27/17    Prior Therapy  no      Precautions   Precautions  None      Restrictions   Weight Bearing Restrictions  No      Balance Screen   Has the patient fallen in the past 6 months  No    Has the patient had a decrease in activity level because of a fear of falling?   Yes    Is the patient reluctant to leave their  home because of a fear of falling?   No      Home Environment   Living Environment  Private residence    Available Help at Discharge  Family    Type of [place] home with two [place] to enter    Entrance Stairs-Number of Steps  1 curb    Entrance Stairs-Rails  None    Home Layout  One level    Additional Comments  10 steps in back, 2 hand rails, difficult      Prior Function   Level of Independence  Independent    Vocation  Full time employment    Vocation Requirements  Sitting with reading groups is difficulty    Leisure  cruises ([place] take a trip)      Cognition   Overall Cognitive Status  Within Functional Limits for tasks assessed      Observation/Other Assessments   Focus on Therapeutic Outcomes (FOTO)   40% limited      Sensation   Light Touch  Appears Intact    Additional Comments  Patient reports "numbess" "fuzzy" "dull" sensation along anterior left knee      Functional Tests   Functional tests  Squat;Step down;Single leg stance      Squat   Comments  Early heel rise bilaterally, decreased posterior pelvic tilt at end range, excessive trunk lean, wide stance, bilateral ankle pronation      Step Down   Comments  6" step: LLE - unable to eccentrically control      Single Leg Stance   Comments  30 seconds BLE with eyes open      Posture/Postural Control   Posture/Postural Control  Postural limitations    Postural Limitations  Rounded Shoulders;Forward head      AROM   Overall AROM Comments  WFL for lumbar spine and BLE    Lumbar Flexion  WFL    Lumbar Extension  WFL, decreased pain with movement    Lumbar - Right Side Bend  WFL, increased discomfort/pulling    Lumbar - Left Side Bend  WFL, decreased pain with movement    Lumbar - Right Rotation  WFL    Lumbar - Left Rotation  [facility]      Strength   Right Hip Flexion  5/5    Right Hip Extension  4+/5    Right Hip ABduction  4+/5    Left Hip Flexion  3+/5    Left Hip Extension   4+/5    Left Hip ABduction  4+/5    Right Knee Flexion  5/5    Right Knee Extension  5/5    Left Knee Flexion  5/5    Left Knee Extension  4+/5    Right Ankle Dorsiflexion  5/5    Left Ankle Dorsiflexion  5/5      Palpation   Spinal mobility  Normal mobility of L1-5with no tenderness    SI assessment   Negative wtih SIJ cluster testing      Special Tests    Special Tests  Hip Special Tests;Lumbar    Lumbar Tests  Slump Test;Straight Leg Raise    Hip Special Tests   Hip Scouring;Other;Patrick (FABER) Test      Slump test   Findings  Negative    Comment  Bilateral      Straight Leg Raise   Findings  Negative    Comment  Bilateral      Saralyn Pilar (FABER) Test   Findings  Negative    Side  Left      Hip Scouring   Findings  Negative    Side  Left      other   Findings  Negative    Comments  SIJ Cluster for clinical prediction rule        Objective measurements completed on examination: See above findings.    [facility] Adult PT Treatment/Exercise - 05/18/17 0001      Exercises   Exercises  Knee/Hip      Knee/Hip Exercises: Supine   Straight Leg Raises  Both;Strengthening;1 set;10 reps;Limitations    Straight Leg Raises Limitations  3-5 second holds       PT Education - 05/18/17 1734    Education provided  Yes    Education Details  Educated on exam findings and initiated HEP. Discussed appropriate POC.    Person(s) Educated  Patient    Methods  Explanation;Handout    Comprehension  Verbalized understanding       PT Short Term Goals - 05/18/17 1747      PT SHORT TERM GOAL #1   Title  Patient will be independent with HEP to improve functional strength.    Time  2    Period  Weeks    Status  New    Target Date  06/01/17      PT SHORT TERM GOAL #2   Title  Patient will report walking each day at school with her class while outside on the track to increase activity level, exercise tolerance, and demonstrate initiation of walking program for improved health.     Time  2    Period  Weeks    Status  New        PT Long Term Goals - 05/18/17 1749      PT LONG TERM GOAL #1   Title  Patient will be able to perform a step down from 6" step on bilateral lower extremities to demonstrate improved functional strength and have an improved MMT of 1 grade for a limited muscle groups tested.    Time  4    Period  Weeks    Status  New    Target Date  06/15/17      PT LONG TERM GOAL #2   Title  Patient will report walking her dog daily for 125-30 minutes to increase activity level, progress walking program,  and demonstrate improved independence with daily fitness program.    Time  4    Period  Weeks    Status  New      PT LONG TERM GOAL #3   Title  Patient will ascend/descend 4x 6" stairs with improved eccentric control and require hand rails to maintain balance.    Time  4    Period  Weeks    Status  New       Plan - 05/18/17 1735    Clinical Impression Statement  Ms. Sulton presents for initial PT evaluation for low back pain radiating in to left lower extremity. Objective and functional testing revealed muscle length deficits of BLE, weakness of left hip flexors, decreased sensation along anterior left knee, and increased discomfort with right lumbar sideband and relief in left side bend and extension. Overall patient's ROM is within functional limits for BLE and lumbar spine. Ms. Santor will benefit from skilled PT services to address current deficits and improve functional mobility to improve QOL.    Clinical Presentation  Stable    Clinical Decision Making  Low    Rehab Potential  Good    Clinical Impairments Affecting Rehab Potential  (+) motivated, (+) positive attitude to perform HEP    PT Frequency  2x / week    PT Duration  4 weeks    PT Treatment/Interventions  ADLs/Self Care Home Management;Electrical Stimulation;Cryotherapy;Moist Heat;Gait training;Stair training;Functional mobility training;Therapeutic activities;Therapeutic  exercise;Balance training;Neuromuscular re-education;Patient/family education;Manual techniques;Passive range of motion;Dry needling;Taping    PT Next Visit Plan  Review evaluation and goals. Initiate hip flexor strengthening with step ups, lunges, warrior poses, hamstring and gastroc stretch, and single limb stance activities. Perform 5x sit to stand test.     PT Home Exercise Plan  Eval: SLR supine;     Consulted and Agree with Plan of Care  Patient       Patient will benefit from skilled therapeutic intervention in order to improve the following deficits and impairments:  Decreased endurance, Impaired sensation, Decreased activity tolerance, Decreased strength, Pain, Decreased mobility, Improper body mechanics, Postural dysfunction, Impaired flexibility  Visit Diagnosis: Muscle weakness (generalized) - Plan: PT plan of care cert/re-cert  Left-sided low back pain with left-sided sciatica, unspecified chronicity - Plan: PT plan of care cert/re-cert  Other symptoms and signs involving the musculoskeletal system - Plan: PT plan of care cert/re-cert     Problem List Patient Active Problem List   Diagnosis Date Noted  . Essential hypertension 05/01/2017  . Hyperlipidemia 05/01/2017  . GERD (gastroesophageal reflux disease) 02/21/2016  . Obesity 02/21/2016  . H/O: hysterectomy 01/23/2016  . Anxiety 03/10/2013    Kipp Brood, PT, DPT Physical Therapist with [facility]  05/18/2017 6:12 PM    [facility] [place], [place], [address] Phone: [phone]   Fax:  [phone]  Name: MARVETTA VOHS MRN: [id number] Date of Birth: 1965-06-30

## 2017-05-18 NOTE — Patient Instructions (Signed)
   STRAIGHT LEG RAISE - SLR: 1-2 sets of 15-20 repetitions  While lying on your back, raise up your leg with a straight knee.  Keep the opposite knee bent with the foot planted on the ground.

## 2017-05-20 ENCOUNTER — Encounter: Payer: Self-pay | Admitting: Family Medicine

## 2017-05-20 ENCOUNTER — Ambulatory Visit (HOSPITAL_COMMUNITY): Payer: BC Managed Care – PPO

## 2017-05-20 ENCOUNTER — Encounter (HOSPITAL_COMMUNITY): Payer: Self-pay

## 2017-05-20 DIAGNOSIS — M6281 Muscle weakness (generalized): Secondary | ICD-10-CM | POA: Diagnosis not present

## 2017-05-20 DIAGNOSIS — R29898 Other symptoms and signs involving the musculoskeletal system: Secondary | ICD-10-CM

## 2017-05-20 DIAGNOSIS — M5442 Lumbago with sciatica, left side: Secondary | ICD-10-CM

## 2017-05-20 NOTE — Therapy (Signed)
[facility] [place], [place], [address] Phone: [phone]   Fax:  [phone]  Physical Therapy Treatment  Patient Details  Name: Nancy [name] MRN: [id number] Date of Birth: 06-03-1965 Referring Provider: Sanjuana Kava, MD   Encounter Date: 05/20/2017  PT End of Session - 05/20/17 1525    Visit Number  2    Number of Visits  9    Date for PT Re-Evaluation  06/15/17    Authorization Type  [employer] cross Blue Shield    Authorization Time Period  05/18/17 - 2/18-19    PT Start Time  1521    PT Stop Time  1600    PT Time Calculation (min)  39 min    Activity Tolerance  Patient tolerated treatment well;No increased pain    Behavior During Therapy  WFL for tasks assessed/performed       Past Medical History:  Diagnosis Date  . Anxiety   . Depression   . Essential hypertension 05/01/2017  . GERD (gastroesophageal reflux disease)   . Hyperlipidemia 05/01/2017  . Hypertension   . Irregular bleeding 03/10/2013    Past Surgical History:  Procedure Laterality Date  . ABDOMINAL HYSTERECTOMY     heavy irreg bleeding  . CHOLECYSTECTOMY    . COLONOSCOPY N/A 10/09/2016   Procedure: COLONOSCOPY;  Surgeon: Daneil Dolin, MD;  [facility];  Service: Endoscopy;  Laterality: N/A;  9:30 AM  . DILATION AND CURETTAGE OF UTERUS    . ENDOMETRIAL ABLATION    . SALPINGOOPHORECTOMY Bilateral 07/06/2013   Procedure: SALPINGO OOPHORECTOMY;  Surgeon: Florian Buff, MD;  Location: [facility];  Service: Gynecology;  Laterality: Bilateral;  . SUPRACERVICAL ABDOMINAL HYSTERECTOMY N/A 07/06/2013   Procedure: HYSTERECTOMY SUPRACERVICAL ABDOMINAL;  Surgeon: Florian Buff, MD;  Location: [facility];  Service: Gynecology;  Laterality: N/A;  . TUBAL LIGATION      There were no vitals filed for this visit.  Subjective Assessment - 05/20/17 1523    Subjective  Pt stated she is a little sore in Lt hip with radicular symptoms ending above ankle.   Reports compliance iwht HEP and walked 1/2 mile yesterday and feels great following.    Currently in Pain?  No/denies soreness no pain Lt anterior hip         [facility] PT Assessment - 05/20/17 0001      Assessment   Medical Diagnosis  Back Pain    Referring Provider  Sanjuana Kava, MD    Onset Date/Surgical Date  04/03/17    Next MD Visit  05/27/17    Prior Therapy  no      Standardized Balance Assessment   Standardized Balance Assessment  Five Times Sit to Stand    Five times sit to stand comments   12.05                  [facility] Adult PT Treatment/Exercise - 05/20/17 0001      Knee/Hip Exercises: Stretches   Active Hamstring Stretch  Both;2 reps;30 seconds 12in step    Hip Flexor Stretch  2 reps;30 seconds    Gastroc Stretch  3 reps;30 seconds slant board      Knee/Hip Exercises: Standing   Forward Lunges  15 reps on 6in, reduce height next session     Lateral Step Up  15 reps;Hand Hold: 1;Step Height: 6"    Forward Step Up  15 reps;Hand Hold: 1;Step Height: 6"    SLS  2 attempts max Lt 60", Rt 50 "    Other Standing Knee Exercises  Warrior pose I 1x 60"; Warrior pose II 1x 30" BLE             PT Education - 05/20/17 1534    Education provided  Yes    Education Details  Reviewed goals, assured compliance with HEP and copy of eval given to pt.      Person(s) Educated  Patient    Methods  Explanation;Demonstration;Handout    Comprehension  Verbalized understanding;Returned demonstration       PT Short Term Goals - 05/18/17 1747      PT SHORT TERM GOAL #1   Title  Patient will be independent with HEP to improve functional strength.    Time  2    Period  Weeks    Status  New    Target Date  06/01/17      PT SHORT TERM GOAL #2   Title  Patient will report walking each day at school with her class while outside on the track to increase activity level, exercise tolerance, and demonstrate initiation of walking program for improved health.    Time  2     Period  Weeks    Status  New        PT Long Term Goals - 05/18/17 1749      PT LONG TERM GOAL #1   Title  Patient will be able to perform a step down from 6" step on bilateral lower extremities to demonstrate improved functional strength and have an improved MMT of 1 grade for a limited muscle groups tested.    Time  4    Period  Weeks    Status  New    Target Date  06/15/17      PT LONG TERM GOAL #2   Title  Patient will report walking her dog daily for 125-30 minutes to increase activity level, progress walking program, and demonstrate improved independence with daily fitness program.    Time  4    Period  Weeks    Status  New      PT LONG TERM GOAL #3   Title  Patient will ascend/descend 4x 6" stairs with improved eccentric control and require hand rails to maintain balance.    Time  4    Period  Weeks    Status  New            Plan - 05/20/17 1538    Clinical Impression Statement  Reviewed goals, assured compliance wiht HEP and copy of eval given to pt.  Pt reoprts complaince daily and able to verbalize proper technqiue wiht HEP as well as beginning walking program.  Session focus on hip strengthening primarly flexors.  Pt able to demonstrate appropriate form with all therex and reports no radicular symptoms during session.  Noted muscle fatigue with activities per weakness.  Pt stated she has radicular symptoms mainly only when sitting, given educated with lumbar roll/postural awareness next session.      Rehab Potential  Good    Clinical Impairments Affecting Rehab Potential  (+) motivated, (+) positive attitude to perform HEP    PT Frequency  2x / week    PT Duration  4 weeks    PT Treatment/Interventions  ADLs/Self Care Home Management;Electrical Stimulation;Cryotherapy;Moist Heat;Gait training;Stair training;Functional mobility training;Therapeutic activities;Therapeutic exercise;Balance training;Neuromuscular re-education;Patient/family education;Manual  techniques;Passive range of motion;Dry needling;Taping    PT Next Visit Plan  Continue wiht hip  flexor strengthening next session and educate benefits with lumbar roll to reduce radicular symptoms while seated.  Progress next session with decreased height with lunges, vector stance, and continue wiht warrior poses/begin warrior III when appropriate.       PT Home Exercise Plan  Eval: SLR supine;        Patient will benefit from skilled therapeutic intervention in order to improve the following deficits and impairments:  Decreased endurance, Impaired sensation, Decreased activity tolerance, Decreased strength, Pain, Decreased mobility, Improper body mechanics, Postural dysfunction, Impaired flexibility  Visit Diagnosis: Left-sided low back pain with left-sided sciatica, unspecified chronicity  Muscle weakness (generalized)  Other symptoms and signs involving the musculoskeletal system     Problem List Patient Active Problem List   Diagnosis Date Noted  . Essential hypertension 05/01/2017  . Hyperlipidemia 05/01/2017  . GERD (gastroesophageal reflux disease) 02/21/2016  . Obesity 02/21/2016  . H/O: hysterectomy 01/23/2016  . Anxiety 03/10/2013   Ihor Austin, LPTA; [place] [facility]  [facility] Lento 05/20/2017, [place], [place], [address] Phone: [phone]   Fax:  [phone]  Name: TRISH MANCINELLI MRN: [id number] Date of Birth: 01-05-1966

## 2017-05-25 ENCOUNTER — Telehealth (HOSPITAL_COMMUNITY): Payer: Self-pay | Admitting: Family Medicine

## 2017-05-25 NOTE — Telephone Encounter (Signed)
05/25/17  pt called and cx said that her work had called staff development and she has to stay for that

## 2017-05-26 ENCOUNTER — Ambulatory Visit (HOSPITAL_COMMUNITY): Payer: BC Managed Care – PPO | Admitting: Physical Therapy

## 2017-05-27 ENCOUNTER — Telehealth (HOSPITAL_COMMUNITY): Payer: Self-pay | Admitting: Physical Therapy

## 2017-05-27 ENCOUNTER — Encounter: Payer: Self-pay | Admitting: Orthopaedic Surgery

## 2017-05-27 ENCOUNTER — Ambulatory Visit (INDEPENDENT_AMBULATORY_CARE_PROVIDER_SITE_OTHER): Payer: BC Managed Care – PPO | Admitting: Orthopaedic Surgery

## 2017-05-27 VITALS — BP 146/92 | HR 85 | Temp 97.1°F | Ht 67.0 in | Wt 231.0 lb

## 2017-05-27 DIAGNOSIS — G8929 Other chronic pain: Secondary | ICD-10-CM | POA: Diagnosis not present

## 2017-05-27 DIAGNOSIS — M5442 Lumbago with sciatica, left side: Secondary | ICD-10-CM | POA: Diagnosis not present

## 2017-05-27 NOTE — Telephone Encounter (Signed)
She has a parent Tree surgeon and will not be here today

## 2017-05-27 NOTE — Progress Notes (Signed)
Patient PY:PPJKDTO Nancy [name], female DOB:[birth year], 52 y.o. IZT:[id number]  Chief Complaint  Patient presents with  . Back Pain    HPI  Nancy [name] is a 52 y.o. female who continues to have left sided sciatica.  She has been to PT and is only slightly better if any.  She is doing her exercises and taking her medicine.  I will get MRI as she is not improved. HPI  Body mass index is 36.18 kg/m.  ROS  Review of Systems  HENT: Negative for congestion.   Respiratory: Negative for cough and shortness of breath.   Cardiovascular: Negative for chest pain and leg swelling.  Musculoskeletal: Positive for arthralgias, back pain and gait problem.  All other systems reviewed and are negative.   Past Medical History:  Diagnosis Date  . Anxiety   . Depression   . Essential hypertension 05/01/2017  . GERD (gastroesophageal reflux disease)   . Hyperlipidemia 05/01/2017  . Hypertension   . Irregular bleeding 03/10/2013    Past Surgical History:  Procedure Laterality Date  . ABDOMINAL HYSTERECTOMY     heavy irreg bleeding  . CHOLECYSTECTOMY    . COLONOSCOPY N/A 10/09/2016   Procedure: COLONOSCOPY;  Surgeon: Daneil Dolin, MD;  Location: [facility];  Service: Endoscopy;  Laterality: N/A;  9:30 AM  . DILATION AND CURETTAGE OF UTERUS    . ENDOMETRIAL ABLATION    . SALPINGOOPHORECTOMY Bilateral 07/06/2013   Procedure: SALPINGO OOPHORECTOMY;  Surgeon: Florian Buff, MD;  Location: [facility];  Service: Gynecology;  Laterality: Bilateral;  . SUPRACERVICAL ABDOMINAL HYSTERECTOMY N/A 07/06/2013   Procedure: HYSTERECTOMY SUPRACERVICAL ABDOMINAL;  Surgeon: Florian Buff, MD;  Location: [facility];  Service: Gynecology;  Laterality: N/A;  . TUBAL LIGATION      Family History  Problem Relation Age of Onset  . COPD Mother   . Heart disease Mother        CHF  . Hypertension Mother   . Cancer Father        kidney met to lung  . Heart attack Father 16       died of heart attack  . Hypertension  Maternal Grandmother   . Heart disease Maternal Grandmother   . Rheum arthritis Maternal Grandmother   . Hypertension Maternal Grandfather   . Heart disease Maternal Grandfather   . Hypertension Paternal Grandmother   . Heart disease Paternal Grandmother   . Hypertension Paternal Grandfather   . Heart disease Paternal Grandfather   . Cancer Sister 56       lung,renal  . Alcohol abuse Sister     Social History Social History   Tobacco Use  . Smoking status: Never Smoker  . Smokeless tobacco: Never Used  Substance Use Topics  . Alcohol use: No  . Drug use: No    No Known Allergies  Current Outpatient Medications  Medication Sig Dispense Refill  . diclofenac (VOLTAREN) 75 MG EC tablet Take 1 tablet (75 mg total) by mouth 2 (two) times daily with a meal. 60 tablet 2  . FLUoxetine (PROZAC) 20 MG tablet TAKE ONE TABLET BY MOUTH ONCE DAILY. 90 tablet 0  . hydrochlorothiazide (HYDRODIURIL) 25 MG tablet Take 1 tablet (25 mg total) by mouth daily. 90 tablet 3  . ibuprofen (ADVIL,MOTRIN) 800 MG tablet Take 1 tablet (800 mg total) by mouth every 8 (eight) hours as needed. 21 tablet 0  . omeprazole (PRILOSEC) 20 MG capsule Take 1 capsule (20 mg total) by mouth daily. (Patient  taking differently: Take 20 mg by mouth daily as needed (heartburn). ) 90 capsule 3   No current facility-administered medications for this visit.      Physical Exam  Blood pressure (!) 146/92, pulse 85, temperature (!) 97.1 F (36.2 C), height 5' 7"  (1.702 m), weight 231 lb (104.8 kg), last menstrual period 07/06/2013.  Constitutional: overall normal hygiene, normal nutrition, well developed, normal grooming, normal body habitus. Assistive device:none  Musculoskeletal: gait and station Limp none, muscle tone and strength are normal, no tremors or atrophy is present.  .  Neurological: coordination overall normal.  Deep tendon reflex/nerve stretch intact.  Sensation normal.  Cranial nerves II-XII intact.    Skin:   Normal overall no scars, lesions, ulcers or rashes. No psoriasis.  Psychiatric: Alert and oriented x 3.  Recent memory intact, remote memory unclear.  Normal mood and affect. Well groomed.  Good eye contact.  Cardiovascular: overall no swelling, no varicosities, no edema bilaterally, normal temperatures of the legs and arms, no clubbing, cyanosis and good capillary refill.  Lymphatic: palpation is normal.  Spine/Pelvis examination:  Inspection:  Overall, sacoiliac joint benign and hips nontender; without crepitus or defects.   Thoracic spine inspection: Alignment normal without kyphosis present   Lumbar spine inspection:  Alignment  with normal lumbar lordosis, without scoliosis apparent.   Thoracic spine palpation:  without tenderness of spinal processes   Lumbar spine palpation: without tenderness of lumbar area; without tightness of lumbar muscles    Range of Motion:   Lumbar flexion, forward flexion is normal without pain or tenderness    Lumbar extension is full without pain or tenderness   Left lateral bend is normal without pain or tenderness   Right lateral bend is normal without pain or tenderness   Straight leg raising is normal  Strength & tone: normal   Stability overall normal stability All other systems reviewed and are negative   The patient has been educated about the nature of the problem(s) and counseled on treatment options.  The patient appeared to understand what I have discussed and is in agreement with it.  Encounter Diagnosis  Name Primary?  . Chronic midline low back pain with left-sided sciatica Yes    PLAN Call if any problems.  Precautions discussed.  Continue current medications.   Return to clinic after MRI of lumbar spine   Electronically Signed Sanjuana Kava, MD 1/30/20198:13 AM

## 2017-05-28 ENCOUNTER — Encounter (HOSPITAL_COMMUNITY): Payer: BC Managed Care – PPO | Admitting: Physical Therapy

## 2017-06-01 ENCOUNTER — Ambulatory Visit
Admission: RE | Admit: 2017-06-01 | Discharge: 2017-06-01 | Disposition: A | Discharge status: Home / Self Care | Payer: BC Managed Care – PPO | Source: Ambulatory Visit | Attending: Orthopaedic Surgery | Admitting: Orthopaedic Surgery

## 2017-06-01 ENCOUNTER — Other Ambulatory Visit: Payer: BC Managed Care – PPO

## 2017-06-01 DIAGNOSIS — M5442 Lumbago with sciatica, left side: Principal | ICD-10-CM

## 2017-06-01 DIAGNOSIS — G8929 Other chronic pain: Secondary | ICD-10-CM

## 2017-06-03 ENCOUNTER — Encounter: Payer: Self-pay | Admitting: Orthopaedic Surgery

## 2017-06-03 ENCOUNTER — Ambulatory Visit: Payer: BC Managed Care – PPO | Admitting: Orthopaedic Surgery

## 2017-06-03 VITALS — BP 128/87 | HR 83 | Temp 97.4°F | Ht 67.0 in | Wt 230.0 lb

## 2017-06-03 DIAGNOSIS — M5442 Lumbago with sciatica, left side: Secondary | ICD-10-CM

## 2017-06-03 DIAGNOSIS — G8929 Other chronic pain: Secondary | ICD-10-CM

## 2017-06-03 NOTE — Progress Notes (Signed)
Patient [name] Nancy [name], female DOB:1965/09/03, 51 y.o. MHD:[id number]  Chief Complaint  Patient presents with  . Back Pain    lumbar  . Results    review MRI    HPI  Nancy [name] is a 52 y.o. female who has left sided paresthesias from her lower back.  She had MRI which shows: IMPRESSION: Small disc protrusion in the left foramen and lateral to the foramen could affect the left L3 nerve root  Moderate spinal stenosis L4-5  I have explained the findings to her.  I will have neurosurgeon see her.  HPI  Body mass index is 36.02 kg/m.  ROS  Review of Systems  HENT: Negative for congestion.   Respiratory: Negative for cough and shortness of breath.   Cardiovascular: Negative for chest pain and leg swelling.  Musculoskeletal: Positive for arthralgias, back pain and gait problem.  All other systems reviewed and are negative.   Past Medical History:  Diagnosis Date  . Anxiety   . Depression   . Essential hypertension 05/01/2017  . GERD (gastroesophageal reflux disease)   . Hyperlipidemia 05/01/2017  . Hypertension   . Irregular bleeding 03/10/2013    Past Surgical History:  Procedure Laterality Date  . ABDOMINAL HYSTERECTOMY     heavy irreg bleeding  . CHOLECYSTECTOMY    . COLONOSCOPY N/A 10/09/2016   Procedure: COLONOSCOPY;  Surgeon: Daneil Dolin, MD;  Location: [facility];  Service: Endoscopy;  Laterality: N/A;  9:30 AM  . DILATION AND CURETTAGE OF UTERUS    . ENDOMETRIAL ABLATION    . SALPINGOOPHORECTOMY Bilateral 07/06/2013   Procedure: SALPINGO OOPHORECTOMY;  Surgeon: Florian Buff, MD;  Location: [facility];  Service: Gynecology;  Laterality: Bilateral;  . SUPRACERVICAL ABDOMINAL HYSTERECTOMY N/A 07/06/2013   Procedure: HYSTERECTOMY SUPRACERVICAL ABDOMINAL;  Surgeon: Florian Buff, MD;  Location: [facility];  Service: Gynecology;  Laterality: N/A;  . TUBAL LIGATION      Family History  Problem Relation Age of Onset  . COPD Mother   . Heart disease  Mother        CHF  . Hypertension Mother   . Cancer Father        kidney met to lung  . Heart attack Father 57       died of heart attack  . Hypertension Maternal Grandmother   . Heart disease Maternal Grandmother   . Rheum arthritis Maternal Grandmother   . Hypertension Maternal Grandfather   . Heart disease Maternal Grandfather   . Hypertension Paternal Grandmother   . Heart disease Paternal Grandmother   . Hypertension Paternal Grandfather   . Heart disease Paternal Grandfather   . Cancer Sister 58       lung,renal  . Alcohol abuse Sister     Social History Social History   Tobacco Use  . Smoking status: Never Smoker  . Smokeless tobacco: Never Used  Substance Use Topics  . Alcohol use: No  . Drug use: No    No Known Allergies  Current Outpatient Medications  Medication Sig Dispense Refill  . diclofenac (VOLTAREN) 75 MG EC tablet Take 1 tablet (75 mg total) by mouth 2 (two) times daily with a meal. 60 tablet 2  . FLUoxetine (PROZAC) 20 MG tablet TAKE ONE TABLET BY MOUTH ONCE DAILY. 90 tablet 0  . hydrochlorothiazide (HYDRODIURIL) 25 MG tablet Take 1 tablet (25 mg total) by mouth daily. 90 tablet 3  . omeprazole (PRILOSEC) 20 MG capsule Take 1 capsule (20 mg total)  by mouth daily. (Patient taking differently: Take 20 mg by mouth daily as needed (heartburn). ) 90 capsule 3  . ibuprofen (ADVIL,MOTRIN) 800 MG tablet Take 1 tablet (800 mg total) by mouth every 8 (eight) hours as needed. (Patient not taking: Reported on 06/03/2017) 21 tablet 0   No current facility-administered medications for this visit.      Physical Exam  Blood pressure 128/87, pulse 83, temperature (!) 97.4 F (36.3 C), height 5' 7" (1.702 m), weight 230 lb (104.3 kg), last menstrual period 07/06/2013.  Constitutional: overall normal hygiene, normal nutrition, well developed, normal grooming, normal body habitus. Assistive device:none  Musculoskeletal: gait and station Limp none, muscle tone and  strength are normal, no tremors or atrophy is present.  .  Neurological: coordination overall normal.  Deep tendon reflex/nerve stretch intact.  Sensation normal.  Cranial nerves II-XII intact.   Skin:   Normal overall no scars, lesions, ulcers or rashes. No psoriasis.  Psychiatric: Alert and oriented x 3.  Recent memory intact, remote memory unclear.  Normal mood and affect. Well groomed.  Good eye contact.  Cardiovascular: overall no swelling, no varicosities, no edema bilaterally, normal temperatures of the legs and arms, no clubbing, cyanosis and good capillary refill.  Lymphatic: palpation is normal.  Spine/Pelvis examination:  Inspection:  Overall, sacoiliac joint benign and hips nontender; without crepitus or defects.   Thoracic spine inspection: Alignment normal without kyphosis present   Lumbar spine inspection:  Alignment  with normal lumbar lordosis, without scoliosis apparent.   Thoracic spine palpation:  without tenderness of spinal processes   Lumbar spine palpation: without tenderness of lumbar area; without tightness of lumbar muscles    Range of Motion:   Lumbar flexion, forward flexion is normal without pain or tenderness    Lumbar extension is full without pain or tenderness   Left lateral bend is normal without pain or tenderness   Right lateral bend is normal without pain or tenderness   Straight leg raising is normal  Strength & tone: normal   Stability overall normal stability All other systems reviewed and are negative   The patient has been educated about the nature of the problem(s) and counseled on treatment options.  The patient appeared to understand what I have discussed and is in agreement with it.  Encounter Diagnosis  Name Primary?  . Chronic midline low back pain with left-sided sciatica Yes    PLAN Call if any problems.  Precautions discussed.  Continue current medications.   Return to clinic to neurosurgeon   Electronically  Signed Sanjuana Kava, MD 2/6/20198:13 AM

## 2017-06-03 NOTE — Patient Instructions (Signed)
..  A referral has been made for you to [facility].  They will call you to schedule the appointment.  If you do not hear from them within a week, please call [phone] and ask for their new patient coordinator.  She should be able to schedule you at that time. 

## 2017-06-03 NOTE — Progress Notes (Signed)
l °

## 2017-07-30 ENCOUNTER — Encounter: Payer: Self-pay | Admitting: Family Medicine

## 2017-07-30 ENCOUNTER — Other Ambulatory Visit: Payer: Self-pay

## 2017-07-30 ENCOUNTER — Ambulatory Visit (INDEPENDENT_AMBULATORY_CARE_PROVIDER_SITE_OTHER): Payer: BC Managed Care – PPO | Admitting: Family Medicine

## 2017-07-30 VITALS — BP 136/80 | HR 89 | Temp 98.6°F | Ht 66.0 in | Wt 229.1 lb

## 2017-07-30 DIAGNOSIS — E785 Hyperlipidemia, unspecified: Secondary | ICD-10-CM

## 2017-07-30 DIAGNOSIS — K219 Gastro-esophageal reflux disease without esophagitis: Secondary | ICD-10-CM | POA: Diagnosis not present

## 2017-07-30 DIAGNOSIS — F419 Anxiety disorder, unspecified: Secondary | ICD-10-CM

## 2017-07-30 DIAGNOSIS — I10 Essential (primary) hypertension: Secondary | ICD-10-CM | POA: Diagnosis not present

## 2017-07-30 MED ORDER — ATENOLOL 25 MG PO TABS: 25.0000 mg | ORAL_TABLET | Freq: Every day | ORAL | 3 refills | Status: DC

## 2017-07-30 NOTE — Patient Instructions (Signed)
Congratuladions on losing weight Stop the HCTZ Take tenormin once a day Call for side effects or problems Continue to stay active  See Dr Mannie Stabile for next visit in 6 months

## 2017-07-30 NOTE — Progress Notes (Signed)
Chief Complaint  Patient presents with  . Follow-up   Patient is here for blood pressure follow-up.  Her blood pressure is well controlled on hydrochlorothiazide.  I discussed with her the recent research that indicates hydrochlorothiazide may increase her risk of skin cancer because of photosensitization.  She does have fair skin.  After discussion we choose to place her on a different anti-hypertensive.  I am going to give her atenolol 25 mg a day and she will follow her blood pressure at home.  She will call me if there is any problems with this medication. She has mild hyperlipidemia treated with diet. She has obesity.  She is trying to lose weight with diet and exercise. When I saw her last she came in for left leg pain.  I thought she might be having some radiculopathy.  I referred her to orthopedics.  An MRI did show herniated disc.  She was seen by Dr. Herold Harms in neurosurgery, and he performed a microdiscectomy in February.  She is here 2 months later, looking great, and back at work.  She has some residual numbness in her left anterior thigh.  No weakness.  Good motion.  No pain requiring medication.  She still careful with her activities.  She is walking every day. Patient takes Prozac for anxiety and mood control.  She feels like it is working well for her.  No side effects. Patient takes omeprazole for heartburn.  She only takes it as needed. Health maintenance up-to-date.  Immunizations up-to-date.  Patient Active Problem List   Diagnosis Date Noted  . Essential hypertension 05/01/2017  . Hyperlipidemia 05/01/2017  . GERD (gastroesophageal reflux disease) 02/21/2016  . Obesity 02/21/2016  . H/O: hysterectomy 01/23/2016  . Anxiety 03/10/2013    Outpatient Encounter Medications as of 07/30/2017  Medication Sig  . diclofenac (VOLTAREN) 75 MG EC tablet Take 1 tablet (75 mg total) by mouth 2 (two) times daily with a meal.  . FLUoxetine (PROZAC) 20 MG tablet TAKE ONE TABLET BY  MOUTH ONCE DAILY.  Marland Kitchen omeprazole (PRILOSEC) 20 MG capsule Take 1 capsule (20 mg total) by mouth daily. (Patient taking differently: Take 20 mg by mouth daily as needed (heartburn). )  . atenolol (TENORMIN) 25 MG tablet Take 1 tablet (25 mg total) by mouth daily.   No facility-administered encounter medications on file as of 07/30/2017.     No Known Allergies  Review of Systems  Constitutional: Negative for activity change, appetite change and unexpected weight change.  HENT: Negative for congestion, dental problem, postnasal drip and rhinorrhea.   Eyes: Negative for redness and visual disturbance.  Respiratory: Negative for cough and shortness of breath.   Cardiovascular: Negative for chest pain, palpitations and leg swelling.  Gastrointestinal: Negative for abdominal pain, constipation and diarrhea.  Genitourinary: Negative for difficulty urinating, frequency and vaginal bleeding.  Musculoskeletal: Positive for back pain. Negative for arthralgias.       Shortness remains, improving  Neurological: Positive for numbness. Negative for dizziness and headaches.  Psychiatric/Behavioral: Negative for dysphoric mood and sleep disturbance. The patient is not nervous/anxious.     BP 136/80   Pulse 89   Temp 98.6 F (37 C) (Temporal)   Ht 5\' 6"  (1.676 m)   Wt 229 lb 1.9 oz (103.9 kg)   LMP 07/06/2013 Comment: serum pregnancy negative on 06/30/2013  SpO2 98%   BMI 36.98 kg/m   Physical Exam  Constitutional: She is oriented to person, place, and time. She appears well-developed and  well-nourished.  Pleasant. Smiling. moderately overweight  HENT:  Head: Normocephalic and atraumatic.  Right Ear: External ear normal.  Left Ear: External ear normal.  Mouth/Throat: Oropharynx is clear and moist.  Eyes: Pupils are equal, round, and reactive to light. Conjunctivae are normal.  Neck: Normal range of motion. Neck supple. No thyromegaly present.  Cardiovascular: Normal rate, regular rhythm and  normal heart sounds.  Pulmonary/Chest: Effort normal and breath sounds normal. No respiratory distress.  Abdominal: Soft. Bowel sounds are normal.  Musculoskeletal: Normal range of motion. She exhibits no edema.  Well-healed central lumbar scar  Lymphadenopathy:    She has no cervical adenopathy.  Neurological: She is alert and oriented to person, place, and time.  Skin: Skin is warm and dry.  Psychiatric: She has a normal mood and affect. Her behavior is normal. Thought content normal.  Nursing note and vitals reviewed.   ASSESSMENT/PLAN:  1. Essential hypertension Controlled on thiazide, but I am changing her to a beta-blocker  2. Hyperlipidemia, unspecified hyperlipidemia type On diet.  Will check lipids yearly  3. Anxiety On Prozac with good results.  No side effects.  4. Gastroesophageal reflux disease, esophagitis presence not specified Controlled with diet.  Occasional omeprazole.   Patient Instructions  Congratuladions on losing weight Stop the HCTZ Take tenormin once a day Call for side effects or problems Continue to stay active  See Dr Mannie Stabile for next visit in 6 months    Raylene Everts, MD

## 2017-08-04 ENCOUNTER — Encounter (HOSPITAL_COMMUNITY): Payer: Self-pay

## 2017-08-04 NOTE — Therapy (Signed)
[place], [place], [address] Phone: [phone]   Fax:  [phone]  Patient Details  Name: Nancy [name] MRN: [id number] Date of Birth: 10/10/1965 Referring Provider:  No ref. provider found  Encounter Date: 08/04/2017   PHYSICAL THERAPY DISCHARGE SUMMARY  Visits from Start of Care: 2  Current functional level related to goals / functional outcomes: Patient is being discharged due to not returning since last visit on 05/20/17.  Ms. Corson presents for PT evaluation on 05/18/17 for low back pain radiating in to left lower extremity. Objective and functional testing revealed muscle length deficits of BLE, weakness of left hip flexors, decreased sensation along anterior left knee, and increased discomfort with right lumbar sideband and relief in left side bend and extension.   Remaining deficits: Goals never re-assessed due to not returning.    Education / Equipment: Educated on Materials engineer and initial HEP.  Plan: Patient agrees to discharge.  Patient goals were not met. Patient is being discharged due to not returning since the last visit.  ?????    Kipp Brood, PT, DPT Physical Therapist with [facility]  08/04/2017 11:03 AM    [place], [place], [address] Phone: [phone]   Fax:  [phone]

## 2017-10-02 ENCOUNTER — Encounter: Payer: Self-pay | Admitting: Family Medicine

## 2017-12-17 ENCOUNTER — Encounter: Payer: Self-pay | Admitting: Family Medicine

## 2018-03-16 ENCOUNTER — Other Ambulatory Visit (HOSPITAL_COMMUNITY): Payer: Self-pay | Admitting: Internal Medicine

## 2018-03-16 DIAGNOSIS — Z1231 Encounter for screening mammogram for malignant neoplasm of breast: Secondary | ICD-10-CM

## 2018-03-18 ENCOUNTER — Ambulatory Visit (HOSPITAL_COMMUNITY)
Admission: RE | Admit: 2018-03-18 | Discharge: 2018-03-18 | Disposition: A | Discharge status: Home / Self Care | Payer: BC Managed Care – PPO | Source: Ambulatory Visit | Attending: Internal Medicine | Admitting: Internal Medicine

## 2018-03-18 DIAGNOSIS — Z1231 Encounter for screening mammogram for malignant neoplasm of breast: Secondary | ICD-10-CM | POA: Diagnosis not present

## 2018-08-10 ENCOUNTER — Ambulatory Visit (INDEPENDENT_AMBULATORY_CARE_PROVIDER_SITE_OTHER): Payer: BC Managed Care – PPO | Admitting: Internal Medicine

## 2018-11-15 ENCOUNTER — Other Ambulatory Visit: Payer: BC Managed Care – PPO

## 2018-11-15 DIAGNOSIS — Z20822 Contact with and (suspected) exposure to covid-19: Secondary | ICD-10-CM

## 2018-11-18 LAB — NOVEL CORONAVIRUS, NAA: SARS-CoV-2, NAA: NOT DETECTED

## 2019-03-02 ENCOUNTER — Ambulatory Visit (INDEPENDENT_AMBULATORY_CARE_PROVIDER_SITE_OTHER): Payer: BC Managed Care – PPO | Admitting: Internal Medicine

## 2019-03-02 ENCOUNTER — Other Ambulatory Visit: Payer: Self-pay

## 2019-03-02 ENCOUNTER — Encounter (INDEPENDENT_AMBULATORY_CARE_PROVIDER_SITE_OTHER): Payer: Self-pay | Admitting: Internal Medicine

## 2019-03-02 VITALS — BP 130/78 | HR 88 | Temp 98.1°F | Resp 18 | Ht 60.0 in | Wt 217.4 lb

## 2019-03-02 DIAGNOSIS — E6609 Other obesity due to excess calories: Secondary | ICD-10-CM | POA: Diagnosis not present

## 2019-03-02 DIAGNOSIS — K219 Gastro-esophageal reflux disease without esophagitis: Secondary | ICD-10-CM

## 2019-03-02 DIAGNOSIS — Z23 Encounter for immunization: Secondary | ICD-10-CM

## 2019-03-02 DIAGNOSIS — I10 Essential (primary) hypertension: Secondary | ICD-10-CM

## 2019-03-02 DIAGNOSIS — E039 Hypothyroidism, unspecified: Secondary | ICD-10-CM

## 2019-03-02 DIAGNOSIS — Z6835 Body mass index (BMI) 35.0-35.9, adult: Secondary | ICD-10-CM

## 2019-03-02 DIAGNOSIS — E782 Mixed hyperlipidemia: Secondary | ICD-10-CM

## 2019-03-02 HISTORY — DX: Hypothyroidism, unspecified: E03.9

## 2019-03-02 NOTE — Progress Notes (Signed)
Metrics: Intervention Frequency ACO  Documented Smoking Status Yearly  Screened one or more times in 24 months  Cessation Counseling or  Active cessation medication Past 24 months  Past 24 months   Guideline developer: UpToDate (See UpToDate for funding source) Date Released: 2014       Wellness Office Visit  Subjective:  Patient ID: Nancy [name], female    DOB: 05/06/1965  Age: 53 y.o. MRN: [id number]  CC: This lady comes in for follow-up of hypothyroidism, hypertension, hyperlipidemia and obesity. HPI  Unfortunately, she has done not so well since the last time I saw her and has managed to gain some weight.  She has been very stressed at work and at home and has not been eating consistently.  She has not been doing intermittent fasting consistently either but she is determined to do better. She tolerated the higher dose of NP thyroid and feels more energized, is sleeping better and has increased libido. Past Medical History:  Diagnosis Date  . Anxiety   . Depression   . Essential hypertension 05/01/2017  . GERD (gastroesophageal reflux disease)   . Hyperlipidemia 05/01/2017  . Hypertension   . Hypothyroidism, adult 03/02/2019  . Irregular bleeding 03/10/2013      Family History  Problem Relation Age of Onset  . COPD Mother   . Heart disease Mother        CHF  . Hypertension Mother   . Cancer Father        kidney met to lung  . Heart attack Father 61       died of heart attack  . Hypertension Maternal Grandmother   . Heart disease Maternal Grandmother   . Rheum arthritis Maternal Grandmother   . Hypertension Maternal Grandfather   . Heart disease Maternal Grandfather   . Hypertension Paternal Grandmother   . Heart disease Paternal Grandmother   . Hypertension Paternal Grandfather   . Heart disease Paternal Grandfather   . Cancer Sister 26       lung,renal  . Alcohol abuse Sister     Social History   Social History Narrative   Married for 2 years,been  together for 14 years.Second marriage.[place]  [employer] officer, [employer].   Social History   Tobacco Use  . Smoking status: Never Smoker  . Smokeless tobacco: Never Used  Substance Use Topics  . Alcohol use: No    Current Meds  Medication Sig  . Cholecalciferol (VITAMIN D-3) 125 MCG (5000 UT) TABS Take 2 tablets by mouth daily.  . hydrochlorothiazide (HYDRODIURIL) 25 MG tablet Take 25 mg by mouth daily.  . NP THYROID 60 MG tablet Take 60 mg by mouth 2 (two) times daily.        Objective:   Today's Vitals: BP 130/78 (BP Location: Left Arm, Patient Position: Sitting, Cuff Size: Normal)   Pulse 88   Temp 98.1 F (36.7 C) (Temporal)   Resp 18   Ht 5' (1.524 m)   Wt 217 lb 6.4 oz (98.6 kg)   LMP 07/06/2013 Comment: serum pregnancy negative on 06/30/2013  HC 67" (170.2 cm)   SpO2 98%   BMI 42.46 kg/m  Vitals with BMI 03/02/2019 07/30/2017 06/03/2017  Height 5' 0"  5' 6"  5' 7"   Weight 217 lbs 6 oz 229 lbs 2 oz 230 lbs  BMI 82.80 37 03.49  Systolic 179 150 569  Diastolic 78 80 87  Pulse 88 89 83     Physical Exam   She looks systemically well.  She  has gained about 3 pounds since the last time I saw her.  No other new physical findings.    Assessment   1. Essential hypertension   2. Gastroesophageal reflux disease, unspecified whether esophagitis present   3. Class 2 obesity due to excess calories without serious comorbidity with body mass index (BMI) of 35.0 to 35.9 in adult   4. Mixed hyperlipidemia   5. Hypothyroidism, adult       Tests ordered Orders Placed This Encounter  Procedures  . T3, Free  . TSH     Plan: 1. She will continue with same dose of antihypertensive medication which seems to be controlling her blood pressure. 2. She will continue with same dose of desiccated NP thyroid and I will check levels today. 3. We discussed nutrition again and she will try to focus more on intermittent fasting combined with a more of a whole food plant-based diet. 4.  Follow-up in 3 months for an annual physical exam. 5. Today I spent 25 minutes with the patient face-to-face, more than 50% of the time was involved in discussing nutrition above.   No orders of the defined types were placed in this encounter.   Doree Albee, MD

## 2019-03-02 NOTE — Patient Instructions (Signed)
[name] Dietary Recommendations for Weight Loss What to Avoid . Avoid added sugars o Often added sugar can be found in processed foods such as many condiments, dry cereals, cakes, cookies, chips, crisps, crackers, candies, sweetened drinks, etc.  o Read labels and AVOID/DECREASE use of foods with the following in their ingredient list: Sugar, fructose, high fructose corn syrup, sucrose, glucose, maltose, dextrose, molasses, cane sugar, brown sugar, any type of syrup, agave nectar, etc.   . Avoid snacking in between meals . Avoid foods made with flour o If you are going to eat food made with flour, choose those made with whole-grains; and, minimize your consumption as much as is tolerable . Avoid processed foods o These foods are generally stocked in the middle of the grocery store. Focus on shopping on the perimeter of the grocery.  What to Include . Vegetables o GREEN LEAFY VEGETABLES: Kale, spinach, mustard greens, collard greens, cabbage, broccoli, etc. o OTHER: Asparagus, cauliflower, eggplant, carrots, peas, Brussel sprouts, tomatoes, bell peppers, zucchini, beets, cucumbers, etc. . Grains, seeds, and legumes o Beans: kidney beans, black eyed peas, garbanzo beans, black beans, pinto beans, etc. o Whole, unrefined grains: brown rice, barley, bulgur, oatmeal, etc. . Healthy fats  o Avoid highly processed fats such as vegetable oil o Examples of healthy fats: avocado, olives, virgin olive oil, dark chocolate (?72% Cocoa), nuts (peanuts, almonds, walnuts, cashews, pecans, etc.) . Low - Moderate Intake of Animal Sources of Protein o Meat sources: chicken, turkey, salmon, tuna. Limit to 4 ounces of meat at one time. o Consider limiting dairy sources, but when choosing dairy focus on: PLAIN Greek yogurt, cottage cheese, high-protein milk . Fruit o Choose berries  When to Eat . Intermittent Fasting: o Choosing not to eat for a specific time period, but DO FOCUS ON HYDRATION  when fasting o Multiple Techniques: - Time Restricted Eating: eat 3 meals in a day, each meal lasting no more than 60 minutes, no snacks between meals - 16-18 hour fast: fast for 16 to 18 hours up to 7 days a week. Often suggested to start with 2-3 nonconsecutive days per week.  . Remember the time you sleep is counted as fasting.  . Examples of eating schedule: Fast from 7:00pm-11:00am. Eat between 11:00am-7:00pm.  - 24-hour fast: fast for 24 hours up to every other day. Often suggested to start with 1 day per week . Remember the time you sleep is counted as fasting . Examples of eating schedule:  o Eating day: eat 2-3 meals on your eating day. If doing 2 meals, each meal should last no more than 90 minutes. If doing 3 meals, each meal should last no more than 60 minutes. Finish last meal by 7:00pm. o Fasting day: Fast until 7:00pm.  o IF YOU FEEL UNWELL FOR ANY REASON/IN ANY WAY WHEN FASTING, STOP FASTING BY EATING A NUTRITIOUS SNACK OR LIGHT MEAL o ALWAYS FOCUS ON HYDRATION DURING FASTS - Acceptable Hydration sources: water, broths, tea/coffee (black tea/coffee is best but using a small amount of whole-fat dairy products in coffee/tea is acceptable).  - Poor Hydration Sources: anything with sugar or artificial sweeteners added to it  These recommendations have been developed for patients that are actively receiving medical care from either Dr. Mikele Sifuentes or Sarah Gray, DNP, NP-C at [facility]. These recommendations are developed for patients with specific medical conditions and are not meant to be distributed or used by others that are not actively receiving care from either provider listed   above at [facility]. It is not appropriate to participate in the above eating plans without proper medical supervision.   Reference: Fung, J. The obesity code. [place]: [place]; 2016.   

## 2019-03-03 ENCOUNTER — Other Ambulatory Visit (HOSPITAL_COMMUNITY): Payer: Self-pay | Admitting: Internal Medicine

## 2019-03-03 DIAGNOSIS — Z1231 Encounter for screening mammogram for malignant neoplasm of breast: Secondary | ICD-10-CM

## 2019-03-03 LAB — T3, FREE: T3, Free: 4.8 pg/mL — ABNORMAL HIGH (ref 2.3–4.2)

## 2019-03-03 LAB — TSH: TSH: 0.01 mIU/L — ABNORMAL LOW

## 2019-03-17 ENCOUNTER — Other Ambulatory Visit (INDEPENDENT_AMBULATORY_CARE_PROVIDER_SITE_OTHER): Payer: Self-pay | Admitting: Internal Medicine

## 2019-03-21 ENCOUNTER — Other Ambulatory Visit: Payer: Self-pay

## 2019-03-21 ENCOUNTER — Ambulatory Visit (HOSPITAL_COMMUNITY)
Admission: RE | Admit: 2019-03-21 | Discharge: 2019-03-21 | Disposition: A | Discharge status: Home / Self Care | Payer: BC Managed Care – PPO | Source: Ambulatory Visit | Attending: Internal Medicine | Admitting: Internal Medicine

## 2019-03-21 DIAGNOSIS — Z1231 Encounter for screening mammogram for malignant neoplasm of breast: Secondary | ICD-10-CM | POA: Diagnosis present

## 2019-03-29 ENCOUNTER — Other Ambulatory Visit (INDEPENDENT_AMBULATORY_CARE_PROVIDER_SITE_OTHER): Payer: Self-pay | Admitting: Internal Medicine

## 2019-06-08 ENCOUNTER — Encounter (INDEPENDENT_AMBULATORY_CARE_PROVIDER_SITE_OTHER): Payer: Self-pay | Admitting: Internal Medicine

## 2019-06-08 ENCOUNTER — Other Ambulatory Visit: Payer: Self-pay

## 2019-06-08 ENCOUNTER — Ambulatory Visit (INDEPENDENT_AMBULATORY_CARE_PROVIDER_SITE_OTHER): Payer: BC Managed Care – PPO | Admitting: Internal Medicine

## 2019-06-08 VITALS — BP 146/89 | HR 88 | Temp 97.3°F | Resp 18 | Ht 66.0 in | Wt 218.0 lb

## 2019-06-08 DIAGNOSIS — Z0001 Encounter for general adult medical examination with abnormal findings: Secondary | ICD-10-CM | POA: Diagnosis not present

## 2019-06-08 DIAGNOSIS — I10 Essential (primary) hypertension: Secondary | ICD-10-CM

## 2019-06-08 DIAGNOSIS — E559 Vitamin D deficiency, unspecified: Secondary | ICD-10-CM

## 2019-06-08 DIAGNOSIS — E6609 Other obesity due to excess calories: Secondary | ICD-10-CM | POA: Diagnosis not present

## 2019-06-08 DIAGNOSIS — Z6835 Body mass index (BMI) 35.0-35.9, adult: Secondary | ICD-10-CM

## 2019-06-08 DIAGNOSIS — E039 Hypothyroidism, unspecified: Secondary | ICD-10-CM

## 2019-06-08 DIAGNOSIS — E782 Mixed hyperlipidemia: Secondary | ICD-10-CM

## 2019-06-08 NOTE — Progress Notes (Signed)
Chief Complaint: This 54 year old lady comes in for an annual physical exam and to address her chronic conditions which are described below. HPI: She has a history of hypertension, hypothyroidism, obesity.  She also has vitamin D deficiency. She is taking medications for hypertension, hypothyroidism vitamin D deficiency and is compliant with them. She has been trying to work hard intermittently on nutrition and has been successful also intermittently.  More recently, because of extra stress with work and family, she has not been as consistent.  However, she is determined to do better. She denies any chest pain, dyspnea, palpitations or limb weakness. She is postmenopausal, surgically induced mostly with no ovaries or uterus.  I have tried her previously on bioidentical hormone therapy with estradiol and progesterone but she was unable to tolerate.  Past Medical History:  Diagnosis Date  . Anxiety   . Depression   . Essential hypertension 05/01/2017  . GERD (gastroesophageal reflux disease)   . Hyperlipidemia 05/01/2017  . Hypertension   . Hypothyroidism, adult 03/02/2019  . Irregular bleeding 03/10/2013   Past Surgical History:  Procedure Laterality Date  . ABDOMINAL HYSTERECTOMY     heavy irreg bleeding  . CHOLECYSTECTOMY    . COLONOSCOPY N/A 10/09/2016   Procedure: COLONOSCOPY;  Surgeon: Daneil Dolin, MD;  [facility];  Service: Endoscopy;  Laterality: N/A;  9:30 AM  . DILATION AND CURETTAGE OF UTERUS    . ENDOMETRIAL ABLATION    . SALPINGOOPHORECTOMY Bilateral 07/06/2013   Procedure: SALPINGO OOPHORECTOMY;  Surgeon: Florian Buff, MD;  Location: [facility];  Service: Gynecology;  Laterality: Bilateral;  . SUPRACERVICAL ABDOMINAL HYSTERECTOMY N/A 07/06/2013   Procedure: HYSTERECTOMY SUPRACERVICAL ABDOMINAL;  Surgeon: Florian Buff, MD;  Location: [facility];  Service: Gynecology;  Laterality: N/A;  . 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    Social History   Tobacco Use  . Smoking status: Never Smoker  . Smokeless tobacco: Never Used  Substance Use Topics  . Alcohol use: No      Allergies: No Known Allergies   Current Meds  Medication Sig  . Cholecalciferol (VITAMIN D-3) 125 MCG (5000 UT) TABS Take 2 tablets by mouth daily.  . hydrochlorothiazide (HYDRODIURIL) 25 MG tablet TAKE 1 TABLET BY MOUTH DAILY.  . NP THYROID 60 MG tablet TAKE 1 TABLET BY MOUTH TWICE DAILY       [id number] from the symptoms mentioned above,there are no other symptoms referable to all systems reviewed.  Physical Exam: Blood pressure (!) 146/89, pulse 88, temperature (!) 97.3 F (36.3 C), temperature source Temporal, resp. rate 18, height 5\' 6"  (1.676 m), weight 218 lb (98.9 kg), last menstrual period 07/06/2013, SpO2 98 %. Vitals with BMI 06/08/2019 03/02/2019 07/30/2017  Height 5\' 6"  5\' 0"  5\' 6"   Weight 218 lbs 217 lbs 6 oz 229 lbs 2 oz  BMI [id number]  Systolic [id number]  Diastolic 89 78 80  Pulse 88 88 89      She looks systemically well.  She remains obese.  He has only gained a pound since her last visit. General: Alert, cooperative, and appears to be the stated age.No pallor.  No jaundice.  No clubbing. Head: Normocephalic Eyes: Sclera white, pupils equal and reactive to light, red reflex x 2,  Ears: Normal bilaterally Oral cavity: Lips, mucosa, and tongue normal: Teeth and  gums normal Neck: No adenopathy, supple, symmetrical, trachea midline, and thyroid does not appear enlarged. Breasts: No masses felt. Respiratory: Clear to auscultation bilaterally.No wheezing, crackles or bronchial breathing. Cardiovascular: Heart sounds are present and appear to be normal without murmurs or added sounds.  No carotid bruits.  Peripheral pulses are present and equal bilaterally.: Gastrointestinal:positive bowel sounds, no hepatosplenomegaly.  No masses  felt.No tenderness. Skin: Clear, No rashes noted.No worrisome skin lesions seen. Neurological: Grossly intact without focal findings, cranial nerves II through XII intact, muscle strength equal bilaterally Musculoskeletal: No acute joint abnormalities noted.Full range of movement noted with joints. Psychiatric: Affect appropriate, non-anxious.    Assessment  1. Essential hypertension   2. Mixed hyperlipidemia   3. Class 2 obesity due to excess calories without serious comorbidity with body mass index (BMI) of 35.0 to 35.9 in adult   4. Hypothyroidism, adult   5. Encounter for general adult medical examination with abnormal findings   6. Vitamin D deficiency disease     Tests Ordered:   Orders Placed This Encounter  Procedures  . CBC  . COMPLETE METABOLIC PANEL WITH GFR  . Hemoglobin A1c  . Lipid panel  . T3, free  . T4  . TSH  . VITAMIN D 25 Hydroxy (Vit-D Deficiency, Fractures)     Plan  1. She will continue with antihypertensive therapy and today her blood pressure is not well controlled because of worrying news that she had about her husband's health. 2. She will continue with the same dose of desiccated NP thyroid for her hypothyroidism and we will check levels today to see if we need to adjust doses. 3. She will continue to take vitamin D3 supplementation and I will check levels today. 4. She will continue to work on nutrition and exercise as before. 5. Further recommendations will depend on blood results and I will see her in 3 months time for follow-up. 6. Today, in addition to a preventative visit, I performed independently an office visit to address her chronic conditions above.     No orders of the defined types were placed in this encounter.    Nancy [name] C Stanislaw Acton   06/08/2019, 4:22 PM

## 2019-06-09 LAB — HEMOGLOBIN A1C
Hgb A1c MFr Bld: 5.4 % of total Hgb (ref <5.7)
Mean Plasma Glucose: 108 (calc)
eAG (mmol/L): 6 (calc)

## 2019-06-09 LAB — CBC
HCT: 38.9 % (ref 35.0–45.0)
Hemoglobin: 13.7 g/dL (ref 11.7–15.5)
MCH: 30.6 pg (ref 27.0–33.0)
MCHC: 35.2 g/dL (ref 32.0–36.0)
MCV: 87 fL (ref 80.0–100.0)
MPV: 11.2 fL (ref 7.5–12.5)
Platelets: 276 10*3/uL (ref 140–400)
RBC: 4.47 10*6/uL (ref 3.80–5.10)
RDW: 13 % (ref 11.0–15.0)
WBC: 6.8 10*3/uL (ref 3.8–10.8)

## 2019-06-09 LAB — LIPID PANEL
Cholesterol: 185 mg/dL (ref <200)
HDL: 56 mg/dL (ref >=50)
LDL Cholesterol (Calc): 109 mg/dL (calc) — ABNORMAL HIGH
Non-HDL Cholesterol (Calc): 129 mg/dL (calc) (ref <130)
Total CHOL/HDL Ratio: 3.3 (calc) (ref <5.0)
Triglycerides: 102 mg/dL (ref <150)

## 2019-06-09 LAB — COMPLETE METABOLIC PANEL WITH GFR
AG Ratio: 2.5 (calc) (ref 1.0–2.5)
ALT: 49 U/L — ABNORMAL HIGH (ref 6–29)
AST: 31 U/L (ref 10–35)
Albumin: 4.7 g/dL (ref 3.6–5.1)
Alkaline phosphatase (APISO): 67 U/L (ref 37–153)
BUN: 11 mg/dL (ref 7–25)
CO2: 30 mmol/L (ref 20–32)
Calcium: 10.2 mg/dL (ref 8.6–10.4)
Chloride: 101 mmol/L (ref 98–110)
Creat: 0.64 mg/dL (ref 0.50–1.05)
GFR, Est African American: 119 mL/min/{1.73_m2} (ref >=60)
GFR, Est Non African American: 103 mL/min/{1.73_m2} (ref >=60)
Globulin: 1.9 g/dL (calc) (ref 1.9–3.7)
Glucose, Bld: 93 mg/dL (ref 65–99)
Potassium: 4.3 mmol/L (ref 3.5–5.3)
Sodium: 140 mmol/L (ref 135–146)
Total Bilirubin: 0.7 mg/dL (ref 0.2–1.2)
Total Protein: 6.6 g/dL (ref 6.1–8.1)

## 2019-06-09 LAB — T3, FREE: T3, Free: 4.8 pg/mL — ABNORMAL HIGH (ref 2.3–4.2)

## 2019-06-09 LAB — TSH: TSH: 0.01 mIU/L — ABNORMAL LOW

## 2019-06-09 LAB — VITAMIN D 25 HYDROXY (VIT D DEFICIENCY, FRACTURES): Vit D, 25-Hydroxy: 93 ng/mL (ref 30–100)

## 2019-06-09 LAB — T4: T4, Total: 12.4 ug/dL — ABNORMAL HIGH (ref 5.1–11.9)

## 2019-06-17 ENCOUNTER — Other Ambulatory Visit (INDEPENDENT_AMBULATORY_CARE_PROVIDER_SITE_OTHER): Payer: Self-pay | Admitting: Internal Medicine

## 2019-06-26 ENCOUNTER — Ambulatory Visit: Payer: BC Managed Care – PPO | Attending: Internal Medicine

## 2019-06-26 DIAGNOSIS — Z23 Encounter for immunization: Secondary | ICD-10-CM

## 2019-06-26 NOTE — Progress Notes (Signed)
   Covid-19 Vaccination Clinic  Name:  [name]    MRN: [id number] DOB: October 17, 1965  06/26/2019  [name] was observed post Covid-19 immunization for 15 minutes without incidence. She was provided with Vaccine Information Sheet and instruction to access the V-Safe system.   [name] was instructed to call 911 with any severe reactions post vaccine: Marland Kitchen Difficulty breathing  . Swelling of your face and throat  . A fast heartbeat  . A bad rash all over your body  . Dizziness and weakness    Immunizations Administered    Name Date Dose VIS Date Route   Moderna COVID-19 Vaccine 06/26/2019  4:30 PM 0.5 mL 03/29/2019 Intramuscular   Manufacturer: Moderna   Lot: [id number]   [id number]

## 2019-07-27 ENCOUNTER — Other Ambulatory Visit (INDEPENDENT_AMBULATORY_CARE_PROVIDER_SITE_OTHER): Payer: Self-pay | Admitting: Internal Medicine

## 2019-07-30 ENCOUNTER — Ambulatory Visit: Payer: BC Managed Care – PPO | Attending: Internal Medicine

## 2019-07-30 DIAGNOSIS — Z23 Encounter for immunization: Secondary | ICD-10-CM

## 2019-07-30 NOTE — Progress Notes (Signed)
   Covid-19 Vaccination Clinic  Name:  Nancy [name]    MRN: [id number] DOB: 02-07-1966  07/30/2019  Ms. Bath was observed post Covid-19 immunization for 15 minutes without incident. She was provided with Vaccine Information Sheet and instruction to access the V-Safe system.   [name] was instructed to call 911 with any severe reactions post vaccine: Marland Kitchen Difficulty breathing  . Swelling of face and throat  . A fast heartbeat  . A bad rash all over body  . Dizziness and weakness   Immunizations Administered    Name Date Dose VIS Date Route   Moderna COVID-19 Vaccine 07/30/2019 11:22 AM 0.5 mL 03/29/2019 Intramuscular   Manufacturer: Moderna   Lot: [id number]   [id number]

## 2019-09-06 ENCOUNTER — Telehealth (INDEPENDENT_AMBULATORY_CARE_PROVIDER_SITE_OTHER): Payer: Self-pay

## 2019-09-06 NOTE — Telephone Encounter (Signed)
Nancy [name] has been exposed to Covid she is asking if tomorrows visit at 4:00 can be virtual, Please advise?

## 2019-09-06 NOTE — Telephone Encounter (Signed)
Patient is aware she has been r/s to June the 9th per her request

## 2019-09-06 NOTE — Telephone Encounter (Signed)
She is hypertensive and I would rather have her come into the office for an appointment so we will have to push this appointment out for 2 weeks.  Please schedule her for approximately 2 weeks from tomorrow.  Thanks.

## 2019-09-07 ENCOUNTER — Ambulatory Visit (INDEPENDENT_AMBULATORY_CARE_PROVIDER_SITE_OTHER): Payer: BC Managed Care – PPO | Admitting: Internal Medicine

## 2019-09-15 ENCOUNTER — Other Ambulatory Visit (INDEPENDENT_AMBULATORY_CARE_PROVIDER_SITE_OTHER): Payer: Self-pay | Admitting: Internal Medicine

## 2019-10-05 ENCOUNTER — Other Ambulatory Visit: Payer: Self-pay

## 2019-10-05 ENCOUNTER — Encounter (INDEPENDENT_AMBULATORY_CARE_PROVIDER_SITE_OTHER): Payer: Self-pay | Admitting: Internal Medicine

## 2019-10-05 ENCOUNTER — Ambulatory Visit (INDEPENDENT_AMBULATORY_CARE_PROVIDER_SITE_OTHER): Payer: BC Managed Care – PPO | Admitting: Internal Medicine

## 2019-10-05 VITALS — BP 130/90 | HR 110 | Temp 97.8°F | Ht 67.0 in | Wt 211.0 lb

## 2019-10-05 DIAGNOSIS — R748 Abnormal levels of other serum enzymes: Secondary | ICD-10-CM

## 2019-10-05 DIAGNOSIS — E039 Hypothyroidism, unspecified: Secondary | ICD-10-CM

## 2019-10-05 DIAGNOSIS — E669 Obesity, unspecified: Secondary | ICD-10-CM | POA: Diagnosis not present

## 2019-10-05 DIAGNOSIS — I10 Essential (primary) hypertension: Secondary | ICD-10-CM

## 2019-10-05 DIAGNOSIS — Z1159 Encounter for screening for other viral diseases: Secondary | ICD-10-CM

## 2019-10-05 MED ORDER — HYDROCHLOROTHIAZIDE 25 MG PO TABS: 25.0000 mg | ORAL_TABLET | Freq: Every day | ORAL | 1 refills | Status: DC

## 2019-10-05 NOTE — Progress Notes (Signed)
Metrics: Intervention Frequency ACO  Documented Smoking Status Yearly  Screened one or more times in 24 months  Cessation Counseling or  Active cessation medication Past 24 months  Past 24 months   Guideline developer: UpToDate (See UpToDate for funding source) Date Released: 2014       Wellness Office Visit  Subjective:  Patient ID: Nancy [name], female    DOB: 05/19/1965  Age: 54 y.o. MRN: [id number]  CC: This lady comes in for follow-up of hypertension, hypothyroidism, obesity. HPI She continues to do well and feels great with current dose of NP thyroid.  She has more energy and feels now that she is treated well with optimization of thyroid. She continues on hydrochlorothiazide for hypertension and she requires refill for this. She continues on vitamin D3 supplementation for vitamin D deficiency. On the last visit, one of the liver enzymes was elevated and we wanted to check this again. She is trying to eat healthier and is managing to lose weight.  She tells me that she is more consistent with intermittent fasting now.  She still continues to eat animal protein.  Past Medical History:  Diagnosis Date  . Anxiety   . Depression   . Essential hypertension 05/01/2017  . GERD (gastroesophageal reflux disease)   . Hyperlipidemia 05/01/2017  . Hypertension   . Hypothyroidism, adult 03/02/2019  . Irregular bleeding 03/10/2013   Past Surgical History:  Procedure Laterality Date  . ABDOMINAL HYSTERECTOMY     heavy irreg bleeding  . CHOLECYSTECTOMY    . COLONOSCOPY N/A 10/09/2016   Procedure: COLONOSCOPY;  Surgeon: Daneil Dolin, MD;  [facility];  Service: Endoscopy;  Laterality: N/A;  9:30 AM  . DILATION AND CURETTAGE OF UTERUS    . ENDOMETRIAL ABLATION    . SALPINGOOPHORECTOMY Bilateral 07/06/2013   Procedure: SALPINGO OOPHORECTOMY;  Surgeon: Florian Buff, MD;  Location: [facility];  Service: Gynecology;  Laterality: Bilateral;  . SUPRACERVICAL ABDOMINAL HYSTERECTOMY  N/A 07/06/2013   Procedure: HYSTERECTOMY SUPRACERVICAL ABDOMINAL;  Surgeon: Florian Buff, MD;  Location: [facility];  Service: Gynecology;  Laterality: N/A;  . TUBAL LIGATION       Family History  Problem Relation Age of Onset  . COPD Mother   . Heart disease Mother        CHF  . Hypertension Mother   . Cancer Father        kidney met to lung  . Heart attack Father 92       died of heart attack  . Hypertension Maternal Grandmother   . Heart disease Maternal Grandmother   . Rheum arthritis Maternal Grandmother   . Hypertension Maternal Grandfather   . Heart disease Maternal Grandfather   . Hypertension Paternal Grandmother   . Heart disease Paternal Grandmother   . Hypertension Paternal Grandfather   . Heart disease Paternal Grandfather   . Cancer Sister 3       lung,renal  . 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   Social History   Tobacco Use  . Smoking status: Never Smoker  . Smokeless tobacco: Never Used  Substance Use Topics  . Alcohol use: No    Current Meds  Medication Sig  . Cholecalciferol (VITAMIN D-3) 125 MCG (5000 UT) TABS Take 2 tablets by mouth daily.  . hydrochlorothiazide (HYDRODIURIL) 25 MG tablet Take 1 tablet (25 mg  total) by mouth daily.  . NP THYROID 60 MG tablet TAKE 1 TABLET BY MOUTH TWICE DAILY  . [DISCONTINUED] hydrochlorothiazide (HYDRODIURIL) 25 MG tablet TAKE 1 TABLET BY MOUTH DAILY.      Depression screen [facility] 2/9 04/08/2017 01/27/2017 08/21/2016 02/21/2016  Decreased Interest 0 0 0 0  Down, Depressed, Hopeless 0 0 0 0  PHQ - 2 Score 0 0 0 0     Objective:   Today's Vitals: BP 130/90 (BP Location: Left Arm, Patient Position: Sitting, Cuff Size: Normal)   Pulse (!) 110   Temp 97.8 F (36.6 C) (Temporal)   Ht 5' 7" (1.702 m)   Wt 211 lb (95.7 kg)   LMP 07/06/2013 Comment: serum pregnancy negative on 06/30/2013   SpO2 96%   BMI 33.05 kg/m  Vitals with BMI 10/05/2019 06/08/2019 03/02/2019  Height 5' 7" 5' 6" 5' 0"  Weight 211 lbs 218 lbs 217 lbs 6 oz  BMI 33.04 75.0 51.07  Systolic 125 247 998  Diastolic 90 89 78  Pulse 001 88 88     Physical Exam   She looks systemically well.  She has lost 7 pounds since February which is great.  Blood pressure is reasonably well controlled on current therapy.  She is alert and orientated without any focal neurological signs.   Assessment   1. Essential hypertension   2. Hypothyroidism, adult   3. Obesity (BMI 30-39.9)   4. Abnormal liver enzymes   5. Encounter for hepatitis C screening test for low risk patient       Tests ordered Orders Placed This Encounter  Procedures  . Cardio IQ Insulin Resistance Panel with Score  . Cardio IQ Adv Lipid and Inflamm Pnl  . Hepatitis C antibody  . COMPLETE METABOLIC PANEL WITH GFR     Plan: 1. For hypertension, she will continue with hydrochlorothiazide which is keeping her blood pressure reasonable level.  I have refilled this today. 2. She will continue with the same dose of desiccated NP thyroid 60 mg twice a day and her T3 levels appear to be optimized for her. 3. As far as her obesity is concerned, we discussed a little bit more about more of a plant-based diet or even plant-based diet with fish would be reasonable.  She will think about this.  She will certainly continue with intermittent fasting also, maintaining hydration. 4. We will check liver enzymes again and also check hepatitis C screening today. 5. I will see her in a couple of weeks to discuss the cardio IQ panel that I have also ordered today to see if there is presence of insulin resistance and also to get a more accurate breakdown of lipid panel.   Meds ordered this encounter  Medications  . hydrochlorothiazide (HYDRODIURIL) 25 MG tablet    Sig: Take 1 tablet (25 mg total) by mouth daily.    Dispense:  90 tablet    Refill:  1     Daril Warga Luther Parody, MD

## 2019-10-10 LAB — CARDIO IQ ADV LIPID AND INFLAMM PNL
Apolipoprotein B: 97 mg/dL — ABNORMAL HIGH (ref <90)
Cholesterol: 202 mg/dL — ABNORMAL HIGH (ref <200)
HDL: 63 mg/dL (ref >49)
LDL Cholesterol (Calc): 121 mg/dL (calc) — ABNORMAL HIGH (ref <100)
LDL Large: 5097 nmol/L — ABNORMAL LOW (ref >6729)
LDL Medium: 383 nmol/L — ABNORMAL HIGH (ref <215)
LDL Particle Number: 1610 nmol/L — ABNORMAL HIGH (ref <1138)
LDL Peak Size: 219.9 Angstrom — ABNORMAL LOW (ref >222.9)
LDL Small: 290 nmol/L — ABNORMAL HIGH (ref <142)
Lipoprotein (a): 10 nmol/L (ref <75)
Non-HDL Cholesterol (Calc): 139 mg/dL (calc) — ABNORMAL HIGH (ref <130)
PLAC: 101 nmol/min/mL (ref <124)
Total CHOL/HDL Ratio: 3.2 calc (ref <3.6)
Triglycerides: 84 mg/dL (ref <150)
hs-CRP: 5.3 mg/L — ABNORMAL HIGH (ref <1.0)

## 2019-10-10 LAB — COMPLETE METABOLIC PANEL WITH GFR
AG Ratio: 2.2 (calc) (ref 1.0–2.5)
ALT: 41 U/L — ABNORMAL HIGH (ref 6–29)
AST: 27 U/L (ref 10–35)
Albumin: 4.7 g/dL (ref 3.6–5.1)
Alkaline phosphatase (APISO): 77 U/L (ref 37–153)
BUN: 10 mg/dL (ref 7–25)
CO2: 25 mmol/L (ref 20–32)
Calcium: 9.8 mg/dL (ref 8.6–10.4)
Chloride: 101 mmol/L (ref 98–110)
Creat: 0.72 mg/dL (ref 0.50–1.05)
GFR, Est African American: 111 mL/min/{1.73_m2} (ref >=60)
GFR, Est Non African American: 96 mL/min/{1.73_m2} (ref >=60)
Globulin: 2.1 g/dL (calc) (ref 1.9–3.7)
Glucose, Bld: 99 mg/dL (ref 65–99)
Potassium: 3.7 mmol/L (ref 3.5–5.3)
Sodium: 136 mmol/L (ref 135–146)
Total Bilirubin: 0.6 mg/dL (ref 0.2–1.2)
Total Protein: 6.8 g/dL (ref 6.1–8.1)

## 2019-10-10 LAB — CARDIO IQ INSULIN RESISTANCE PANEL WITH SCORE
C-PEPTIDE, LC/MS/MS: 2.19 ng/mL — ABNORMAL HIGH (ref 0.68–2.16)
INSULIN, INTACT, LC/MS/MS: 17 u[IU]/mL — ABNORMAL HIGH (ref <=16)
Insulin Resistance Score: 77 — ABNORMAL HIGH (ref <=66)

## 2019-10-10 LAB — HEPATITIS C ANTIBODY
Hepatitis C Ab: NONREACTIVE
SIGNAL TO CUT-OFF: 0 (ref <1.00)

## 2019-11-01 ENCOUNTER — Encounter (INDEPENDENT_AMBULATORY_CARE_PROVIDER_SITE_OTHER): Payer: Self-pay | Admitting: Internal Medicine

## 2019-11-01 ENCOUNTER — Ambulatory Visit (INDEPENDENT_AMBULATORY_CARE_PROVIDER_SITE_OTHER): Payer: BC Managed Care – PPO | Admitting: Internal Medicine

## 2019-11-01 ENCOUNTER — Other Ambulatory Visit: Payer: Self-pay

## 2019-11-01 VITALS — BP 132/80 | HR 101 | Temp 97.8°F | Resp 18 | Ht 67.0 in | Wt 210.0 lb

## 2019-11-01 DIAGNOSIS — I10 Essential (primary) hypertension: Secondary | ICD-10-CM | POA: Diagnosis not present

## 2019-11-01 DIAGNOSIS — E039 Hypothyroidism, unspecified: Secondary | ICD-10-CM | POA: Diagnosis not present

## 2019-11-01 DIAGNOSIS — E669 Obesity, unspecified: Secondary | ICD-10-CM

## 2019-11-01 DIAGNOSIS — E782 Mixed hyperlipidemia: Secondary | ICD-10-CM

## 2019-11-01 MED ORDER — THYROID 60 MG PO TABS: 60.0000 mg | ORAL_TABLET | Freq: Two times a day (BID) | ORAL | 1 refills | Status: DC

## 2019-11-01 NOTE — Progress Notes (Signed)
Metrics: Intervention Frequency ACO  Documented Smoking Status Yearly  Screened one or more times in 24 months  Cessation Counseling or  Active cessation medication Past 24 months  Past 24 months   Guideline developer: UpToDate (See UpToDate for funding source) Date Released: 2014       Wellness Office Visit  Subjective:  Patient ID: Nancy [name], female    DOB: 1965/08/30  Age: 54 y.o. MRN: [id number]  CC: This lady comes in for follow-up of hypothyroidism, hypertension, obesity and hyperlipidemia. HPI She is doing reasonably well.  She has tried hard to move to a plant-based diet but it is more difficult and she is still making changes in her diet.  She continues to fast for 16 hours every day and some days she can go for 18 hours. She is tolerating desiccated NP thyroid and her previous T3 level was 4.8. I discussed with her cardio IQ lipid panel and insulin resistance score in detail today.  She clearly has insulin resistance and we will follow this.  Also, her LDL particle number is elevated and C-reactive protein is also elevated, putting her at high risk of coronary artery disease.  I explained all this to her today.  Past Medical History:  Diagnosis Date  . Anxiety   . Depression   . Essential hypertension 05/01/2017  . GERD (gastroesophageal reflux disease)   . Hyperlipidemia 05/01/2017  . Hypertension   . Hypothyroidism, adult 03/02/2019  . Irregular bleeding 03/10/2013   Past Surgical History:  Procedure Laterality Date  . ABDOMINAL HYSTERECTOMY     heavy irreg bleeding  . CHOLECYSTECTOMY    . COLONOSCOPY N/A 10/09/2016   Procedure: COLONOSCOPY;  Surgeon: Daneil Dolin, MD;  [facility];  Service: Endoscopy;  Laterality: N/A;  9:30 AM  . DILATION AND CURETTAGE OF UTERUS    . ENDOMETRIAL ABLATION    . SALPINGOOPHORECTOMY Bilateral 07/06/2013   Procedure: SALPINGO OOPHORECTOMY;  Surgeon: Florian Buff, MD;  Location: [facility];  Service: Gynecology;   Laterality: Bilateral;  . SUPRACERVICAL ABDOMINAL HYSTERECTOMY N/A 07/06/2013   Procedure: HYSTERECTOMY SUPRACERVICAL ABDOMINAL;  Surgeon: Florian Buff, MD;  Location: [facility];  Service: Gynecology;  Laterality: N/A;  . TUBAL LIGATION       Family History  Problem Relation Age of Onset  . COPD Mother   . Heart disease Mother        CHF  . Hypertension Mother   . Cancer Father        kidney met to lung  . Heart attack Father 21       died of heart attack  . Hypertension Maternal Grandmother   . Heart disease Maternal Grandmother   . Rheum arthritis Maternal Grandmother   . Hypertension Maternal Grandfather   . Heart disease Maternal Grandfather   . Hypertension Paternal Grandmother   . Heart disease Paternal Grandmother   . Hypertension Paternal Grandfather   . Heart disease Paternal Grandfather   . Cancer Sister 73       lung,renal  . 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   Social History   Tobacco Use  . Smoking status: Never Smoker  . Smokeless tobacco: Never Used  Substance Use Topics  . Alcohol use: No    Current Meds  Medication Sig  . Cholecalciferol (VITAMIN D-3) 125 MCG (5000 UT) TABS Take  2 tablets by mouth daily.  . hydrochlorothiazide (HYDRODIURIL) 25 MG tablet Take 1 tablet (25 mg total) by mouth daily.  Marland Kitchen thyroid (NP THYROID) 60 MG tablet Take 1 tablet (60 mg total) by mouth 2 (two) times daily.  . [DISCONTINUED] NP THYROID 60 MG tablet TAKE 1 TABLET BY MOUTH TWICE DAILY      Depression screen [facility] 2/9 04/08/2017 01/27/2017 08/21/2016 02/21/2016  Decreased Interest 0 0 0 0  Down, Depressed, Hopeless 0 0 0 0  PHQ - 2 Score 0 0 0 0     Objective:   Today's Vitals: BP 132/80 (BP Location: Left Arm, Patient Position: Sitting, Cuff Size: Normal)   Pulse (!) 101   Temp 97.8 F (36.6 C) (Temporal)   Resp 18   Ht 5' 7"  (1.702 m)    Wt 210 lb (95.3 kg) Comment: at home wt was 207lb  LMP 07/06/2013 Comment: serum pregnancy negative on 06/30/2013  SpO2 97%   BMI 32.89 kg/m  Vitals with BMI 11/01/2019 10/05/2019 06/08/2019  Height 5' 7"  5' 7"  5' 6"   Weight 210 lbs 211 lbs 218 lbs  BMI 32.88 86.77 37.3  Systolic 668 159 470  Diastolic 80 90 89  Pulse 761 110 88     Physical Exam   She looks systemically well.  She has lost 1 pound since the last time I saw her about a month ago.  Blood pressure is in a good range.  She remains obese.   Assessment   1. Hypothyroidism, adult   2. Essential hypertension   3. Mixed hyperlipidemia   4. Obesity (BMI 30-39.9)       Tests ordered No orders of the defined types were placed in this encounter.    Plan: 1. We discussed her hypothyroidism I think we can optimize thyroid further so I given her samples of NP thyroid 15 mg tablets which she will take 1 daily and eventually go to 1 twice a day and therefore the total dose of NP thyroid she will be taking 75 mg twice a day hopefully. 2. She will continue with hydrochlorothiazide which seems to be maintaining her blood pressure under good control. 3. She will try to focus on her obesity and nutrition and I have recommended that she try to fast for 18 hours every day or at least most days of the week now.  Also she will try to focus including more vegetables and plant into her diet. 4. Follow-up in 3 months   Meds ordered this encounter  Medications  . thyroid (NP THYROID) 60 MG tablet    Sig: Take 1 tablet (60 mg total) by mouth 2 (two) times daily.    Dispense:  180 tablet    Refill:  1    Myrth Dahan Luther Parody, MD

## 2019-11-22 ENCOUNTER — Other Ambulatory Visit (INDEPENDENT_AMBULATORY_CARE_PROVIDER_SITE_OTHER): Payer: Self-pay | Admitting: Internal Medicine

## 2020-02-01 ENCOUNTER — Ambulatory Visit (INDEPENDENT_AMBULATORY_CARE_PROVIDER_SITE_OTHER): Payer: BC Managed Care – PPO | Admitting: Internal Medicine

## 2020-02-07 ENCOUNTER — Encounter (INDEPENDENT_AMBULATORY_CARE_PROVIDER_SITE_OTHER): Payer: Self-pay | Admitting: Internal Medicine

## 2020-02-28 ENCOUNTER — Ambulatory Visit: Payer: BC Managed Care – PPO | Admitting: Family Medicine

## 2020-02-28 ENCOUNTER — Other Ambulatory Visit (HOSPITAL_COMMUNITY): Payer: Self-pay | Admitting: Family Medicine

## 2020-02-28 ENCOUNTER — Other Ambulatory Visit: Payer: Self-pay

## 2020-02-28 ENCOUNTER — Encounter: Payer: Self-pay | Admitting: Family Medicine

## 2020-02-28 VITALS — BP 134/80 | HR 88 | Ht 67.0 in | Wt 208.0 lb

## 2020-02-28 DIAGNOSIS — Z6832 Body mass index (BMI) 32.0-32.9, adult: Secondary | ICD-10-CM

## 2020-02-28 DIAGNOSIS — I10 Essential (primary) hypertension: Secondary | ICD-10-CM | POA: Diagnosis not present

## 2020-02-28 DIAGNOSIS — E6609 Other obesity due to excess calories: Secondary | ICD-10-CM | POA: Diagnosis not present

## 2020-02-28 DIAGNOSIS — Z23 Encounter for immunization: Secondary | ICD-10-CM | POA: Diagnosis not present

## 2020-02-28 DIAGNOSIS — E039 Hypothyroidism, unspecified: Secondary | ICD-10-CM

## 2020-02-28 DIAGNOSIS — Z1231 Encounter for screening mammogram for malignant neoplasm of breast: Secondary | ICD-10-CM

## 2020-02-28 NOTE — Patient Instructions (Signed)
  HAPPY FALL!  I appreciate the opportunity to provide you with care for your health and wellness. Today we discussed: established care  Follow up: CPE in March  Labs- [facility] here next week or at standing office on [address] this week No referrals today  Nice to meet you today!  Have a great Holiday Season :)  Please continue to practice social distancing to keep you, your family, and our community safe.  If you must go out, please wear a mask and practice good handwashing.  It was a pleasure to see you and I look forward to continuing to work together on your health and well-being. Please do not hesitate to call the office if you need care or have questions about your care.  Have a wonderful day and week. With Gratitude, Cherly Beach, DNP, AGNP-BC

## 2020-02-28 NOTE — Progress Notes (Signed)
Subjective:  Patient ID: Nancy [name], female    DOB: 1965-09-04  Age: 54 y.o. MRN: [id number]  CC:  Chief Complaint  Patient presents with  . New Patient (Initial Visit)    here to establish, no complaints today      HPI  HPI   Nancy [name] is a pleasant 54 year old female who presents today to establish care.  Was previously seen by Dr. Meda Coffee and wanted return to the clinic.  Additionally husband's insurance had changed and he also comes to this clinic if she were to be at the same office.  She denies having any concerns as of today.  Does need to have some updated lab work and she is willing to have that.  She denies having any trouble sleeping.  She denies having any changes in chewing or swallowing.  Denies having any trouble using the restroom.  No blood in urine or stool.  Updated colonoscopy.  Mammogram is to be scheduled 6 weeks after she gets her booster shot this week.  Flu shot given today.  Up-to-date on her Tdap.  Has not had any of the pneumonia vaccines.  She denies having any skin issues, hearing issues or vision issues.  She denies having any falls or injuries.  She is very active and loves being outdoors.  She works as a Pharmacist, hospital to help students read.  She does have a some hair and nail changes.  She can take several days before she can use the restroom but does count of her pattern.  She denies having any excessive cold intolerance at this time.  She denies having any active chest pain, cough, shortness of breath, headaches, vision changes or dizziness today in the office.  Today patient denies signs and symptoms of COVID 19 infection including fever, chills, cough, shortness of breath, and headache. Past Medical, Surgical, Social History, Allergies, and Medications have been Reviewed.   Past Medical History:  Diagnosis Date  . Anxiety   . Depression   . Essential hypertension 05/01/2017  . GERD (gastroesophageal reflux disease)   . Hyperlipidemia  05/01/2017  . Hypertension   . Hypothyroidism, adult 03/02/2019  . Irregular bleeding 03/10/2013  . Thyroid disease    Phreesia 02/26/2020    Current Meds  Medication Sig  . Cholecalciferol (VITAMIN D-3) 125 MCG (5000 UT) TABS Take 2 tablets by mouth daily.  . hydrochlorothiazide (HYDRODIURIL) 25 MG tablet Take 1 tablet (25 mg total) by mouth daily.  . NP THYROID 60 MG tablet TAKE 1 TABLET BY MOUTH TWICE DAILY    ROS:  Review of Systems  HENT: Negative.   Eyes: Negative.   Respiratory: Negative.   Cardiovascular: Negative.   Gastrointestinal: Negative.   Genitourinary: Negative.   Musculoskeletal: Negative.   Skin: Negative.   Neurological: Negative.   Endo/Heme/Allergies: Negative.   Psychiatric/Behavioral: Negative.      Objective:   Today's Vitals: BP 134/80 (BP Location: Left Arm, Patient Position: Sitting, Cuff Size: Normal)   Pulse 88   Ht 5\' 7"  (1.702 m)   Wt 208 lb (94.3 kg)   LMP 07/06/2013 Comment: serum pregnancy negative on 06/30/2013  SpO2 100%   BMI 32.58 kg/m  Vitals with BMI 02/28/2020 11/01/2019 10/05/2019  Height 5\' 7"  5\' 7"  5\' 7"   Weight 208 lbs 210 lbs 211 lbs  BMI 32.57 46.65 99.35  Systolic 701 779 390  Diastolic 80 80 90  Pulse 88 101 110     Physical Exam Vitals and nursing  note reviewed.  Constitutional:      Appearance: Normal appearance. She is well-developed and well-groomed. She is obese.  HENT:     Head: Normocephalic and atraumatic.     Right Ear: External ear normal.     Left Ear: External ear normal.     Mouth/Throat:     Comments: Mask in place Eyes:     General:        Right eye: No discharge.        Left eye: No discharge.     Conjunctiva/sclera: Conjunctivae normal.  Cardiovascular:     Rate and Rhythm: Normal rate and regular rhythm.     Pulses: Normal pulses.     Heart sounds: Normal heart sounds.  Pulmonary:     Effort: Pulmonary effort is normal.     Breath sounds: Normal breath sounds.  Musculoskeletal:         General: Normal range of motion.     Cervical back: Normal range of motion and neck supple.  Skin:    General: Skin is warm.  Neurological:     General: No focal deficit present.     Mental Status: She is alert and oriented to person, place, and time.  Psychiatric:        Attention and Perception: Attention normal.        Mood and Affect: Mood normal.        Speech: Speech normal.        Behavior: Behavior normal. Behavior is cooperative.        Thought Content: Thought content normal.        Cognition and Memory: Cognition normal.        Judgment: Judgment normal.          Assessment   1. Essential hypertension   2. Hypothyroidism, adult   3. Need for immunization against influenza   4. Class 1 obesity due to excess calories with serious comorbidity and body mass index (BMI) of 32.0 to 32.9 in adult     Tests ordered Orders Placed This Encounter  Procedures  . Flu Vaccine QUAD 36+ mos IM  . CBC  . Comprehensive metabolic panel  . TSH     Plan: Please see assessment and plan per problem list above.   No orders of the defined types were placed in this encounter.   Patient to follow-up in 07/12/2020   Note: This dictation was prepared with Dragon dictation along with smaller phrase technology. Similar sounding words can be transcribed inadequately or may not be corrected upon review. Any transcriptional errors that result from this process are unintentional.      Perlie Mayo, NP

## 2020-02-29 DIAGNOSIS — Z23 Encounter for immunization: Secondary | ICD-10-CM | POA: Insufficient documentation

## 2020-02-29 NOTE — Assessment & Plan Note (Signed)
Blood pressure on the higher end of normal.  We will get updated labs and adjust as needed.  Encouraged DASH diet and exercise 30 minutes most days of the week walking is suggested

## 2020-02-29 NOTE — Assessment & Plan Note (Signed)

## 2020-02-29 NOTE — Assessment & Plan Note (Signed)
Needs updated lab work.  Is currently on NP thyroid.  Is okay transitioning back to Synthroid.  Will use up the current medication until we transition.  Has not had her thyroid checked in several months.  She does report having some signs and symptoms of hypothyroidism today.  We will follow-up post results.

## 2020-02-29 NOTE — Assessment & Plan Note (Addendum)
Improved  Nancy [name] is re-educated about the importance of exercise daily to help with weight management. A minumum of 30 minutes daily is recommended. Additionally, importance of healthy food choices  with portion control discussed.  Wt Readings from Last 3 Encounters:  02/28/20 208 lb (94.3 kg)  11/01/19 210 lb (95.3 kg)  10/05/19 211 lb (95.7 kg)

## 2020-03-03 LAB — COMPREHENSIVE METABOLIC PANEL
ALT: 47 IU/L — ABNORMAL HIGH (ref 0–32)
AST: 31 IU/L (ref 0–40)
Albumin/Globulin Ratio: 2.9 — ABNORMAL HIGH (ref 1.2–2.2)
Albumin: 4.6 g/dL (ref 3.8–4.9)
Alkaline Phosphatase: 91 IU/L (ref 44–121)
BUN/Creatinine Ratio: 15 (ref 9–23)
BUN: 9 mg/dL (ref 6–24)
Bilirubin Total: 0.4 mg/dL (ref 0.0–1.2)
CO2: 27 mmol/L (ref 20–29)
Calcium: 9.8 mg/dL (ref 8.7–10.2)
Chloride: 97 mmol/L (ref 96–106)
Creatinine, Ser: 0.6 mg/dL (ref 0.57–1.00)
GFR calc Af Amer: 120 mL/min/{1.73_m2} (ref >59)
GFR calc non Af Amer: 104 mL/min/{1.73_m2} (ref >59)
Globulin, Total: 1.6 g/dL (ref 1.5–4.5)
Glucose: 89 mg/dL (ref 65–99)
Potassium: 4.6 mmol/L (ref 3.5–5.2)
Sodium: 137 mmol/L (ref 134–144)
Total Protein: 6.2 g/dL (ref 6.0–8.5)

## 2020-03-03 LAB — CBC
Hematocrit: 39 % (ref 34.0–46.6)
Hemoglobin: 13.4 g/dL (ref 11.1–15.9)
MCH: 29.8 pg (ref 26.6–33.0)
MCHC: 34.4 g/dL (ref 31.5–35.7)
MCV: 87 fL (ref 79–97)
Platelets: 254 10*3/uL (ref 150–450)
RBC: 4.49 x10E6/uL (ref 3.77–5.28)
RDW: 12.6 % (ref 11.7–15.4)
WBC: 7.7 10*3/uL (ref 3.4–10.8)

## 2020-03-03 LAB — TSH: TSH: 0.005 u[IU]/mL — ABNORMAL LOW (ref 0.450–4.500)

## 2020-03-05 ENCOUNTER — Encounter: Payer: Self-pay | Admitting: Family Medicine

## 2020-03-14 LAB — SPECIMEN STATUS REPORT

## 2020-03-14 LAB — T4, FREE: Free T4: 1.25 ng/dL (ref 0.82–1.77)

## 2020-03-26 ENCOUNTER — Ambulatory Visit (HOSPITAL_COMMUNITY): Payer: BC Managed Care – PPO

## 2020-04-10 ENCOUNTER — Other Ambulatory Visit: Payer: Self-pay

## 2020-04-10 MED ORDER — HYDROCHLOROTHIAZIDE 25 MG PO TABS
25.0000 mg | ORAL_TABLET | Freq: Every day | ORAL | 1 refills | Status: DC
Start: 2020-04-10 — End: 2020-10-08

## 2020-04-10 MED ORDER — THYROID 60 MG PO TABS
60.0000 mg | ORAL_TABLET | Freq: Two times a day (BID) | ORAL | 3 refills | Status: DC
Start: 2020-04-10 — End: 2020-04-12

## 2020-04-11 ENCOUNTER — Encounter: Payer: Self-pay | Admitting: Family Medicine

## 2020-04-12 ENCOUNTER — Encounter: Payer: Self-pay | Admitting: Family Medicine

## 2020-04-12 ENCOUNTER — Other Ambulatory Visit: Payer: Self-pay | Admitting: Family Medicine

## 2020-04-12 DIAGNOSIS — E039 Hypothyroidism, unspecified: Secondary | ICD-10-CM

## 2020-04-12 MED ORDER — LEVOTHYROXINE SODIUM 112 MCG PO TABS: 112.0000 ug | ORAL_TABLET | Freq: Every day | ORAL | 3 refills | Status: DC

## 2020-04-16 ENCOUNTER — Other Ambulatory Visit: Payer: Self-pay

## 2020-04-16 ENCOUNTER — Ambulatory Visit (HOSPITAL_COMMUNITY)
Admission: RE | Admit: 2020-04-16 | Discharge: 2020-04-16 | Disposition: A | Discharge status: Home / Self Care | Payer: BC Managed Care – PPO | Source: Ambulatory Visit | Attending: Family Medicine | Admitting: Family Medicine

## 2020-04-16 DIAGNOSIS — Z1231 Encounter for screening mammogram for malignant neoplasm of breast: Secondary | ICD-10-CM | POA: Diagnosis present

## 2020-05-04 ENCOUNTER — Other Ambulatory Visit: Payer: Self-pay | Admitting: *Deleted

## 2020-05-04 ENCOUNTER — Other Ambulatory Visit: Payer: Self-pay

## 2020-05-04 ENCOUNTER — Encounter: Payer: Self-pay | Admitting: Family Medicine

## 2020-05-04 ENCOUNTER — Encounter: Payer: Self-pay | Admitting: Nurse Practitioner

## 2020-05-04 ENCOUNTER — Telehealth (INDEPENDENT_AMBULATORY_CARE_PROVIDER_SITE_OTHER): Payer: Self-pay | Admitting: Nurse Practitioner

## 2020-05-04 DIAGNOSIS — J069 Acute upper respiratory infection, unspecified: Secondary | ICD-10-CM | POA: Insufficient documentation

## 2020-05-04 MED ORDER — BENZONATATE 100 MG PO CAPS: 100.0000 mg | ORAL_CAPSULE | Freq: Two times a day (BID) | ORAL | 0 refills | Status: DC | PRN

## 2020-05-04 MED ORDER — NOREL AD 4-10-325 MG PO TABS: 1.0000 | ORAL_TABLET | ORAL | 1 refills | Status: DC | PRN

## 2020-05-04 NOTE — Assessment & Plan Note (Signed)
-  started 3 days ago -she is getting COVID testing today -Rx. norel -Rx. Tessalon for mild cough -if covid positive, notify us -if symptoms persist through 05/08/20 will consider abx and/or prednisone

## 2020-05-04 NOTE — Telephone Encounter (Signed)
Phone visit made with gray 05-04-20

## 2020-05-04 NOTE — Progress Notes (Signed)
Acute Office Visit  Subjective:    Patient ID: Nancy [name], female    DOB: Sep 10, 1965, 55 y.o.   MRN: [id number]  Chief Complaint  Patient presents with  . Cough    X 3 days   . Nasal Congestion    X 3 days  . Headache    X 2 days     HPI Patient is in today for sick visit.  She has lost sense of taste and smell as well.  She is getting COVID testing today.  She has tried taking OTC sinus medication. Denies fever.  Past Medical History:  Diagnosis Date  . Anxiety   . Depression   . Essential hypertension 05/01/2017  . GERD (gastroesophageal reflux disease)   . Hyperlipidemia 05/01/2017  . Hypertension   . Hypothyroidism, adult 03/02/2019  . Irregular bleeding 03/10/2013  . Thyroid disease    Phreesia 02/26/2020    Past Surgical History:  Procedure Laterality Date  . ABDOMINAL HYSTERECTOMY     heavy irreg bleeding  . CHOLECYSTECTOMY    . COLONOSCOPY N/A 10/09/2016   Procedure: COLONOSCOPY;  Surgeon: Daneil Dolin, MD;  Location: [facility];  Service: Endoscopy;  Laterality: N/A;  9:30 AM  . DILATION AND CURETTAGE OF UTERUS    . ENDOMETRIAL ABLATION    . SALPINGOOPHORECTOMY Bilateral 07/06/2013   Procedure: SALPINGO OOPHORECTOMY;  Surgeon: Florian Buff, MD;  Location: [facility];  Service: Gynecology;  Laterality: Bilateral;  . SUPRACERVICAL ABDOMINAL HYSTERECTOMY N/A 07/06/2013   Procedure: HYSTERECTOMY SUPRACERVICAL ABDOMINAL;  Surgeon: Florian Buff, MD;  Location: [facility];  Service: Gynecology;  Laterality: N/A;  . TUBAL LIGATION      Family History  Problem Relation Age of Onset  . COPD Mother   . Heart disease Mother        CHF  . Hypertension Mother   . Cancer Father        kidney met to lung  . Heart attack Father 51       died of heart attack  . Hypertension Maternal Grandmother   . Heart disease Maternal Grandmother   . Rheum arthritis Maternal Grandmother   . Hypertension Maternal Grandfather   . Heart disease Maternal Grandfather   .  Hypertension Paternal Grandmother   . Heart disease Paternal Grandmother   . Hypertension Paternal Grandfather   . Heart disease Paternal Grandfather   . Cancer Sister 66       lung,renal  . Alcohol abuse Sister     Social History   Socioeconomic History  . Marital status: Married    Spouse name: Not on file  . Number of children: 2  . Years of education: 57  . Highest education level: Not on file  Occupational History  . Occupation: Product manager: [employer]    Comment: reading specialist   Tobacco Use  . Smoking status: Never Smoker  . Smokeless tobacco: Never Used  Substance and Sexual Activity  . Alcohol use: No  . Drug use: No  . Sexual activity: Yes    Birth control/protection: Surgical  Other Topics Concern  . Not on file  Social History Narrative   Lives w husbandMarried for 3 years but dating for 17 plus. Second marriage.[place] [employer] officer, museum.   2 daughters grown in [place]   7 grand and 1 great       Dog: LabDoodley: Shokan      Enjoys: cruising, camp- being outside  Diet: eats all food groups    Caffeine: coffee- 16 oz; 16 oz   Water: 6-8 cups       Wears seat belt   Does not use phone while driving   Smoke Designer, television/film set in lock box   Social Determinants of Health   Financial Resource Strain: Low Risk   . Difficulty of Paying Living Expenses: Not hard at all  Food Insecurity: No Food Insecurity  . Worried About Charity fundraiser in the Last Year: Never true  . Ran Out of Food in the Last Year: Never true  Transportation Needs: No Transportation Needs  . Lack of Transportation (Medical): No  . Lack of Transportation (Non-Medical): No  Physical Activity: Insufficiently Active  . Days of Exercise per Week: 3 days  . Minutes of Exercise per Session: 40 min  Stress: No Stress Concern Present  . Feeling of Stress : Not at all  Social Connections: Moderately Isolated  . Frequency of Communication with  Friends and Family: More than three times a week  . Frequency of Social Gatherings with Friends and Family: Three times a week  . Attends Religious Services: Never  . Active Member of Clubs or Organizations: No  . Attends Archivist Meetings: Never  . Marital Status: Married  Human resources officer Violence: Not At Risk  . Fear of Current or Ex-Partner: No  . Emotionally Abused: No  . Physically Abused: No  . Sexually Abused: No    Outpatient Medications Prior to Visit  Medication Sig Dispense Refill  . Cholecalciferol (VITAMIN D-3) 125 MCG (5000 UT) TABS Take 2 tablets by mouth daily.    . hydrochlorothiazide (HYDRODIURIL) 25 MG tablet Take 1 tablet (25 mg total) by mouth daily. 90 tablet 1  . levothyroxine (SYNTHROID) 112 MCG tablet Take 1 tablet (112 mcg total) by mouth daily. 30 tablet 3   No facility-administered medications prior to visit.    No Known Allergies  Review of Systems  Constitutional: Positive for chills and fatigue. Negative for fever.  HENT: Positive for congestion, sinus pressure and sinus pain. Negative for rhinorrhea and sore throat.   Respiratory: Positive for cough, shortness of breath and wheezing. Negative for chest tightness.        SOB is with exertion  Cardiovascular: Negative.   Gastrointestinal: Positive for nausea.  Neurological: Positive for headaches.       Objective:    Physical Exam  LMP 07/06/2013 Comment: serum pregnancy negative on 06/30/2013 Wt Readings from Last 3 Encounters:  02/28/20 208 lb (94.3 kg)  11/01/19 210 lb (95.3 kg)  10/05/19 211 lb (95.7 kg)    Health Maintenance Due  Topic Date Due  . COVID-19 Vaccine (3 - Booster for Moderna series) 01/29/2020    There are no preventive care reminders to display for this patient.   Lab Results  Component Value Date   TSH 0.005 (L) 03/02/2020   Lab Results  Component Value Date   WBC 7.7 03/02/2020   HGB 13.4 03/02/2020   HCT 39.0 03/02/2020   MCV 87 03/02/2020    PLT 254 03/02/2020   Lab Results  Component Value Date   NA 137 03/02/2020   K 4.6 03/02/2020   CO2 27 03/02/2020   GLUCOSE 89 03/02/2020   BUN 9 03/02/2020   CREATININE 0.60 03/02/2020   BILITOT 0.4 03/02/2020   ALKPHOS 91 03/02/2020   AST 31 03/02/2020   ALT 47 (H) 03/02/2020  PROT 6.2 03/02/2020   ALBUMIN 4.6 03/02/2020   CALCIUM 9.8 03/02/2020   ANIONGAP 8 02/01/2017   Lab Results  Component Value Date   CHOL 202 (H) 10/05/2019   Lab Results  Component Value Date   HDL 63 10/05/2019   Lab Results  Component Value Date   LDLCALC 121 (H) 10/05/2019   Lab Results  Component Value Date   TRIG 84 10/05/2019   Lab Results  Component Value Date   CHOLHDL 3.2 10/05/2019   Lab Results  Component Value Date   HGBA1C 5.4 06/08/2019       Assessment & Plan:   Problem List Items Addressed This Visit      Respiratory   URI (upper respiratory infection)    -started 3 days ago -she is getting COVID testing today -Rx. norel -Rx. Tessalon for mild cough -if covid positive, notify us -if symptoms persist through 05/08/20 will consider abx and/or prednisone          Meds ordered this encounter  Medications  . Chlorphen-PE-Acetaminophen (NOREL AD) 4-10-325 MG TABS    Sig: Take 1 tablet by mouth every 4 (four) hours as needed (nasal congestion, cold symptoms).    Dispense:  20 tablet    Refill:  1  . benzonatate (TESSALON) 100 MG capsule    Sig: Take 1 capsule (100 mg total) by mouth 2 (two) times daily as needed for cough.    Dispense:  20 capsule    Refill:  0   Date:  05/04/2020   Location of Patient: Home Location of Provider: Office Consent was obtain for visit to be over via telehealth. I verified that I am speaking with the correct person using two identifiers.  I connected with  Nancy [name] on 05/04/20 via telephone and verified that I am speaking with the correct person using two identifiers.   I discussed the limitations of evaluation  and management by telemedicine. The patient expressed understanding and agreed to proceed.  Time spent: 9 min   Noreene Larsson, NP

## 2020-05-08 ENCOUNTER — Encounter: Payer: Self-pay | Admitting: Family Medicine

## 2020-07-03 ENCOUNTER — Encounter: Payer: Self-pay | Admitting: Family Medicine

## 2020-07-03 ENCOUNTER — Ambulatory Visit (INDEPENDENT_AMBULATORY_CARE_PROVIDER_SITE_OTHER): Payer: BC Managed Care – PPO | Admitting: Family Medicine

## 2020-07-03 ENCOUNTER — Other Ambulatory Visit: Payer: Self-pay

## 2020-07-03 VITALS — BP 133/87 | HR 73 | Temp 98.7°F | Resp 18 | Ht 67.0 in | Wt 209.0 lb

## 2020-07-03 DIAGNOSIS — R5383 Other fatigue: Secondary | ICD-10-CM

## 2020-07-03 DIAGNOSIS — E039 Hypothyroidism, unspecified: Secondary | ICD-10-CM

## 2020-07-03 DIAGNOSIS — Z0001 Encounter for general adult medical examination with abnormal findings: Secondary | ICD-10-CM | POA: Diagnosis not present

## 2020-07-03 DIAGNOSIS — I10 Essential (primary) hypertension: Secondary | ICD-10-CM | POA: Diagnosis not present

## 2020-07-03 DIAGNOSIS — Z6832 Body mass index (BMI) 32.0-32.9, adult: Secondary | ICD-10-CM

## 2020-07-03 DIAGNOSIS — E782 Mixed hyperlipidemia: Secondary | ICD-10-CM

## 2020-07-03 DIAGNOSIS — E6609 Other obesity due to excess calories: Secondary | ICD-10-CM

## 2020-07-03 NOTE — Assessment & Plan Note (Signed)
Unchanged  Nancy [name] is re-educated about the importance of exercise daily to help with weight management. A minumum of 30 minutes daily is recommended. Additionally, importance of healthy food choices  with portion control discussed.   Wt Readings from Last 3 Encounters:  07/03/20 209 lb (94.8 kg)  02/28/20 208 lb (94.3 kg)  11/01/19 210 lb (95.3 kg)

## 2020-07-03 NOTE — Assessment & Plan Note (Signed)
Increased fatigue today.  Possibly related to thyroid we will check vitamin D and thyroid level.

## 2020-07-03 NOTE — Progress Notes (Signed)
Health Maintenance reviewed -   Immunization History  Administered Date(s) Administered   Influenza,inj,Quad PF,6+ Mos 01/23/2016, 01/27/2017, 03/02/2019, 02/28/2020   Moderna Sars-Covid-2 Vaccination 06/26/2019, 07/30/2019, 02/28/2020   Tdap 02/21/2016   Last Pap smear: Complete Uterus and ovary removal - no longer needed Last mammogram: 03/2020 Last colonoscopy: 2018  Last DEXA: Due 67  Dentist: Year- no trouble chewing or swallowing Ophtho: Glasses- July of 2021 mild changes  Exercise: None at this time Smoker: None  Alcohol Use: None  Other doctors caring for patient include:  Patient Care Team: Perlie Mayo, NP as PCP - General (Family Medicine)  End of Life Discussion:  Patient does not have a living will and medical power of attorney  Subjective:   HPI  Nancy [name] is a 55 y.o. female who presents for annual comprehensive visit and follow-up on chronic medical conditions.  She has the following concerns: overall doing well no issues to discuss today.  Denies having any sleep issues.  Denies having any changes in chewing or swallowing.  Denies having any changes in bowel or bladder habits.  No blood in urine or stool.  Denies having any memory or confusion.  Denies having any falls or injuries.  Denies having any skin issues.  Denies having any vision issues.  Denies having any hearing issues.  Does have some mild hair loss increased fatigue.  Possibly related to hypothyroidism.  Needs updated labs.  Unfortunately not fasting today.  Review Of Systems  Review of Systems  Constitutional: Negative.   HENT: Negative.   Eyes: Negative.   Respiratory: Negative.   Cardiovascular: Negative.   Gastrointestinal: Negative.   Endocrine: Negative.   Genitourinary: Negative.   Musculoskeletal: Negative.   Skin: Negative.   Neurological: Negative.   Psychiatric/Behavioral: Negative.     Objective:   PHYSICAL EXAM:  BP 133/87 (BP Location: Right  Arm, Patient Position: Sitting, Cuff Size: Normal)    Pulse 73    Temp 98.7 F (37.1 C) (Oral)    Resp 18    Ht 5\' 7"  (1.702 m)    Wt 209 lb (94.8 kg)    LMP 07/06/2013 Comment: serum pregnancy negative on 06/30/2013   SpO2 99%    BMI 32.73 kg/m    Physical Exam Vitals and nursing note reviewed.  Constitutional:      Appearance: Normal appearance. She is obese.  HENT:     Head: Normocephalic and atraumatic.     Right Ear: Tympanic membrane, ear canal and external ear normal.     Left Ear: Tympanic membrane, ear canal and external ear normal.     Mouth/Throat:     Comments: Mask in place  Eyes:     Extraocular Movements: Extraocular movements intact.     Conjunctiva/sclera: Conjunctivae normal.     Pupils: Pupils are equal, round, and reactive to light.  Neck:     Thyroid: No thyroid mass, thyromegaly or thyroid tenderness.     Vascular: No carotid bruit.  Cardiovascular:     Rate and Rhythm: Normal rate and regular rhythm.     Pulses: Normal pulses.          Radial pulses are 2+ on the right side and 2+ on the left side.       Dorsalis pedis pulses are 2+ on the right side and 2+ on the left side.     Heart sounds: Normal heart sounds.  Pulmonary:     Effort: Pulmonary effort is normal.  Breath sounds: Normal breath sounds.  Abdominal:     General: Abdomen is flat. Bowel sounds are normal.     Palpations: Abdomen is soft.     Tenderness: There is no abdominal tenderness. There is no right CVA tenderness or left CVA tenderness.     Hernia: No hernia is present.  Musculoskeletal:        General: Normal range of motion.     Cervical back: Normal range of motion and neck supple.     Right lower leg: No edema.     Left lower leg: No edema.     Comments: ROM intact   Lymphadenopathy:     Cervical: No cervical adenopathy.  Skin:    General: Skin is warm and dry.     Capillary Refill: Capillary refill takes less than 2 seconds.  Neurological:     General: No focal deficit  present.     Mental Status: She is alert and oriented to person, place, and time. Mental status is at baseline.     Cranial Nerves: Cranial nerves are intact.     Sensory: Sensation is intact.     Motor: Motor function is intact.     Gait: Gait is intact.     Deep Tendon Reflexes: Reflexes are normal and symmetric.     Comments: Mild imbalance when standing one legged- use of counter to help  Psychiatric:        Attention and Perception: Attention and perception normal.        Mood and Affect: Mood and affect normal.        Speech: Speech normal.        Behavior: Behavior normal. Behavior is cooperative.        Thought Content: Thought content normal.        Cognition and Memory: Cognition and memory normal.        Judgment: Judgment normal.      Depression Screening  Depression screen [facility] 2/9 07/03/2020 05/04/2020 02/28/2020 02/28/2020 04/08/2017  Decreased Interest 0 0 0 0 0  Down, Depressed, Hopeless 0 0 0 0 0  PHQ - 2 Score 0 0 0 0 0     Falls  Fall Risk  07/03/2020 05/04/2020 02/28/2020 08/21/2016 02/21/2016  Falls in the past year? 0 0 0 No No  Number falls in past yr: 0 0 0 - -  Injury with Fall? 0 0 0 - -  Risk for fall due to : No Fall Risks No Fall Risks No Fall Risks - -  Follow up Falls evaluation completed Falls evaluation completed Falls evaluation completed - -    Assessment & Plan:   1. Annual visit for general adult medical examination with abnormal findings   2. Hypothyroidism, adult   3. Essential hypertension   4. Mixed hyperlipidemia   5. Class 1 obesity due to excess calories with serious comorbidity and body mass index (BMI) of 32.0 to 32.9 in adult   6. Fatigue, unspecified type     Tests ordered Orders Placed This Encounter  Procedures   CBC   Comprehensive metabolic panel   Hemoglobin A1c   Lipid panel   VITAMIN D 25 Hydroxy (Vit-D Deficiency, Fractures)   TSH     Plan: Please see assessment and plan per problem list above.   No  orders of the defined types were placed in this encounter.  I have personally reviewed: The patient's medical and social history Their use of alcohol, tobacco or illicit drugs  Their current medications and supplements The patient's functional ability including ADLs,fall risks, home safety risks, cognitive, and hearing and visual impairment Diet and physical activities Evidence for depression or mood disorders  The patient's weight, height, BMI, and visual acuity have been recorded in the chart.  I have made referrals, counseling, and provided education to the patient based on review of the above and I have provided the patient with a written personalized care plan for preventive services.     Perlie Mayo, NP   07/03/2020

## 2020-07-03 NOTE — Patient Instructions (Addendum)
I appreciate the opportunity to provide you with care for your health and wellness.  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 Happy Spring and start to Summer :)  Please continue to practice social distancing to keep you, your family, and our community safe.  If you must go out, please wear a mask and practice good handwashing.  It was a pleasure to see you and I look forward to continuing to work together on your health and well-being. Please do not hesitate to call the office if you need care or have questions about your care.  Have a wonderful day. With Gratitude, Cherly Beach, DNP, AGNP-BC  HEALTH MAINTENANCE RECOMMENDATIONS:  It is recommended that you get at least 30 minutes of aerobic exercise at least 5 days/week (for weight loss, you may need as much as 60-90 minutes). This can be any activity that gets your heart rate up. This can be divided in 10-15 minute intervals if needed, but try and build up your endurance at least once a week.  Weight bearing exercise is also recommended twice weekly.  Eat a healthy diet with lots of vegetables, fruits and fiber.  "Colorful" foods have a lot of vitamins (ie green vegetables, tomatoes, red peppers, etc).  Limit sweet tea, regular sodas and alcoholic beverages, all of which has a lot of calories and sugar.  Up to 1 alcoholic drink daily may be beneficial for women (unless trying to lose weight, watch sugars).  Drink a lot of water.  Calcium recommendations are 1200-1500 mg daily (1500 mg for postmenopausal women or women without ovaries), and vitamin D 1000 IU daily.  This should be obtained from diet and/or supplements (vitamins), and calcium should not be taken all at once, but in divided doses.  Monthly self breast exams and yearly mammograms for women over the age of 42 is recommended.  Sunscreen of at least SPF 30  should be used on all sun-exposed parts of the skin when outside between the hours of 10 am and 4 pm (not just when at beach or pool, but even with exercise, golf, tennis, and yard work!)  Use a sunscreen that says "broad spectrum" so it covers both UVA and UVB rays, and make sure to reapply every 1-2 hours.  Remember to change the batteries in your smoke detectors when changing your clock times in the spring and fall.  Use your seat belt every time you are in a car, and please drive safely and not be distracted with cell phones and texting while driving.

## 2020-07-03 NOTE — Assessment & Plan Note (Signed)
Nancy [name] is encouraged to maintain a well balanced diet that is low in salt. Controlled, continue current medication regimen.  Additionally, she is also reminded that exercise is beneficial for heart health and control of  Blood pressure. 30-60 minutes daily is recommended-walking was suggested.

## 2020-07-03 NOTE — Assessment & Plan Note (Signed)
Discussed monthly self breast exams and yearly mammograms; at least 30 minutes of aerobic activity at least 5 days/week and weight-bearing exercise 2x/week; proper sunscreen use reviewed; healthy diet, including goals of calcium and vitamin D intake and alcohol recommendations (less than or equal to 1 drink/day) reviewed; regular seatbelt use; changing batteries in smoke detectors.  Immunization recommendations discussed.  Colonoscopy recommendations reviewed.  

## 2020-07-03 NOTE — Assessment & Plan Note (Signed)
Needs updated lab work.  Has been transitioned to Synthroid.  Did not get updated lab her last check.  Will be getting updated labs within this next week.  Has some signs she reports today in conversation but otherwise is doing well.

## 2020-07-03 NOTE — Assessment & Plan Note (Signed)
Updated labs ordered.  Heart healthy diet encouraged.

## 2020-07-05 ENCOUNTER — Other Ambulatory Visit: Payer: Self-pay | Admitting: Family Medicine

## 2020-07-05 DIAGNOSIS — E039 Hypothyroidism, unspecified: Secondary | ICD-10-CM

## 2020-07-05 LAB — LIPID PANEL
Chol/HDL Ratio: 3.4 ratio (ref 0.0–4.4)
Cholesterol, Total: 196 mg/dL (ref 100–199)
HDL: 58 mg/dL (ref >39)
LDL Chol Calc (NIH): 128 mg/dL — ABNORMAL HIGH (ref 0–99)
Triglycerides: 52 mg/dL (ref 0–149)
VLDL Cholesterol Cal: 10 mg/dL (ref 5–40)

## 2020-07-05 LAB — CBC
Hematocrit: 39.7 % (ref 34.0–46.6)
Hemoglobin: 13.9 g/dL (ref 11.1–15.9)
MCH: 30.3 pg (ref 26.6–33.0)
MCHC: 35 g/dL (ref 31.5–35.7)
MCV: 87 fL (ref 79–97)
Platelets: 278 10*3/uL (ref 150–450)
RBC: 4.58 x10E6/uL (ref 3.77–5.28)
RDW: 13.3 % (ref 11.7–15.4)
WBC: 6.3 10*3/uL (ref 3.4–10.8)

## 2020-07-05 LAB — COMPREHENSIVE METABOLIC PANEL
ALT: 35 IU/L — ABNORMAL HIGH (ref 0–32)
AST: 24 IU/L (ref 0–40)
Albumin/Globulin Ratio: 2.6 — ABNORMAL HIGH (ref 1.2–2.2)
Albumin: 4.7 g/dL (ref 3.8–4.9)
Alkaline Phosphatase: 86 IU/L (ref 44–121)
BUN/Creatinine Ratio: 14 (ref 9–23)
BUN: 10 mg/dL (ref 6–24)
Bilirubin Total: 0.4 mg/dL (ref 0.0–1.2)
CO2: 25 mmol/L (ref 20–29)
Calcium: 9.5 mg/dL (ref 8.7–10.2)
Chloride: 102 mmol/L (ref 96–106)
Creatinine, Ser: 0.74 mg/dL (ref 0.57–1.00)
Globulin, Total: 1.8 g/dL (ref 1.5–4.5)
Glucose: 99 mg/dL (ref 65–99)
Potassium: 4.7 mmol/L (ref 3.5–5.2)
Sodium: 141 mmol/L (ref 134–144)
Total Protein: 6.5 g/dL (ref 6.0–8.5)
eGFR: 96 mL/min/{1.73_m2} (ref >59)

## 2020-07-05 LAB — VITAMIN D 25 HYDROXY (VIT D DEFICIENCY, FRACTURES): Vit D, 25-Hydroxy: 92 ng/mL (ref 30.0–100.0)

## 2020-07-05 LAB — HEMOGLOBIN A1C
Est. average glucose Bld gHb Est-mCnc: 111 mg/dL
Hgb A1c MFr Bld: 5.5 % (ref 4.8–5.6)

## 2020-07-05 LAB — TSH: TSH: 0.03 u[IU]/mL — ABNORMAL LOW (ref 0.450–4.500)

## 2020-07-06 ENCOUNTER — Encounter: Payer: Self-pay | Admitting: Family Medicine

## 2020-07-09 ENCOUNTER — Telehealth: Payer: Self-pay

## 2020-07-09 NOTE — Telephone Encounter (Signed)
Can you send in pts Rx of her changed Synthroid dose please.

## 2020-07-10 ENCOUNTER — Other Ambulatory Visit: Payer: Self-pay | Admitting: Family Medicine

## 2020-07-10 ENCOUNTER — Other Ambulatory Visit: Payer: Self-pay

## 2020-07-10 ENCOUNTER — Encounter: Payer: Self-pay | Admitting: Family Medicine

## 2020-07-10 DIAGNOSIS — E039 Hypothyroidism, unspecified: Secondary | ICD-10-CM

## 2020-07-10 MED ORDER — LEVOTHYROXINE SODIUM 100 MCG PO TABS: 100.0000 ug | ORAL_TABLET | Freq: Every day | ORAL | 2 refills | Status: DC

## 2020-07-10 NOTE — Telephone Encounter (Signed)
Done- I wanted to make sure she knew we needed to change before I ordered it. Is the TSH in for 6-8 weeks? If not please place for this.

## 2020-07-10 NOTE — Telephone Encounter (Signed)
Lab ordered.

## 2020-07-12 ENCOUNTER — Ambulatory Visit: Payer: BC Managed Care – PPO | Admitting: Family Medicine

## 2020-08-30 ENCOUNTER — Encounter: Payer: Self-pay | Admitting: Family Medicine

## 2020-08-30 LAB — TSH: TSH: 0.096 u[IU]/mL — ABNORMAL LOW (ref 0.450–4.500)

## 2020-08-30 NOTE — Progress Notes (Signed)
LVM for patient to schedule phone visit

## 2020-09-03 ENCOUNTER — Encounter: Payer: Self-pay | Admitting: Family Medicine

## 2020-09-03 ENCOUNTER — Telehealth: Payer: BC Managed Care – PPO | Admitting: Family Medicine

## 2020-09-03 ENCOUNTER — Other Ambulatory Visit: Payer: Self-pay

## 2020-09-03 VITALS — Ht 67.0 in | Wt 207.0 lb

## 2020-09-03 DIAGNOSIS — Z6832 Body mass index (BMI) 32.0-32.9, adult: Secondary | ICD-10-CM

## 2020-09-03 DIAGNOSIS — R7989 Other specified abnormal findings of blood chemistry: Secondary | ICD-10-CM | POA: Diagnosis not present

## 2020-09-03 DIAGNOSIS — E6609 Other obesity due to excess calories: Secondary | ICD-10-CM

## 2020-09-03 DIAGNOSIS — E039 Hypothyroidism, unspecified: Secondary | ICD-10-CM | POA: Diagnosis not present

## 2020-09-03 DIAGNOSIS — I10 Essential (primary) hypertension: Secondary | ICD-10-CM

## 2020-09-03 MED ORDER — LEVOTHYROXINE SODIUM 75 MCG PO TABS: 75.0000 ug | ORAL_TABLET | Freq: Every day | ORAL | 3 refills | Status: DC

## 2020-09-03 NOTE — Assessment & Plan Note (Signed)
  Patient re-educated about  the importance of commitment to a  minimum of 150 minutes of exercise per week as able.  The importance of healthy food choices with portion control discussed, as well as eating regularly and within a 12 hour window most days. The need to choose "clean , green" food 50 to 75% of the time is discussed, as well as to make water the primary drink and set a goal of 64 ounces water daily.    Weight /BMI 09/03/2020 07/03/2020 02/28/2020  WEIGHT 207 lb 209 lb 208 lb  HEIGHT 5\' 7"  5\' 7"  5\' 7"   BMI 32.42 kg/m2 32.73 kg/m2 32.58 kg/m2    Gradually losing weight which is excellent Now commited to exercise 5 days/ week

## 2020-09-03 NOTE — Assessment & Plan Note (Signed)
DASH diet and commitment to daily physical activity for a minimum of 30 minutes discussed and encouraged, as a part of hypertension management. The importance of attaining a healthy weight is also discussed.  BP/Weight 09/03/2020 07/03/2020 02/28/2020 11/01/2019 10/05/2019 06/08/2019 71/0/6269  Systolic BP - 485 462 703 500 938 182  Diastolic BP - 87 80 80 90 89 78  Wt. (Lbs) 207 209 208 210 211 218 217.4  BMI 32.42 32.73 32.58 32.89 33.05 35.19 42.46   Controlled when checked in office

## 2020-09-03 NOTE — Assessment & Plan Note (Signed)
Over corrected, reduce synthroid dose to 75 mchg and re eval in 8 weks Order US thyroid gland.  Pt is taking medication appropriately

## 2020-09-03 NOTE — Patient Instructions (Signed)
F/U in Septembers as before, call if you need [place] sooner  Lower dose synthroid starting tomorrow, 75 mcg one tablet once daily  You are referred for an [place] of your thyroid gland , we will call with appointment information  Please get TSH repeated the week of July 11  It is important that you exercise regularly at least 30 minutes 5 times a week. If you develop chest pain, have severe difficulty breathing, or feel very tired, stop exercising immediately and seek medical attention   Think about what you will eat, plan ahead. Choose " clean, green, fresh or frozen" over canned, processed or packaged foods which are more sugary, salty and fatty. 70 to 75% of food eaten should be vegetables and fruit. Three meals at set times with snacks allowed between meals, but they must be fruit or vegetables. Aim to eat over a 12 hour period , example 7 am to 7 pm, and STOP after  your last meal of the day. Drink water,generally about 64 ounces per day, no other drink is as healthy. Fruit juice is best enjoyed in a healthy way, by EATING the fruit.  Thanks for choosing [facility], we consider it a privelige to serve you.

## 2020-09-03 NOTE — Addendum Note (Signed)
Addended by: Laretta Bolster on: 09/03/2020 01:35 PM   Modules accepted: Orders

## 2020-09-03 NOTE — Progress Notes (Signed)
Virtual Visit via Telephone Note  I connected with Stacie Glaze on 09/03/20 at  1:00 PM EDT by telephone and verified that I am speaking with the correct person using two identifiers.  Location: Patient: work Provider: office   I discussed the limitations, risks, security and privacy concerns of performing an evaluation and management service by telephone and the availability of in person appointments. I also discussed with the patient that there may be a patient responsible charge related to this service. The patient expressed understanding and agreed to proceed.   History of Present Illness: Review recent labs and address abnormal tSH Denies excess tremor , unintentional weight loss, chronically feels  hot all the time   Observations/Objective: Ht 5\' 7"  (1.702 m)   Wt 207 lb (93.9 kg)   LMP 07/06/2013 Comment: serum pregnancy negative on 06/30/2013  BMI 32.42 kg/m       Assessment and Plan: Hypothyroidism, adult Over corrected, reduce synthroid dose to 75 mchg and re eval in 8 weks Order US thyroid gland.  Pt is taking medication appropriately  Obesity  Patient re-educated about  the importance of commitment to a  minimum of 150 minutes of exercise per week as able.  The importance of healthy food choices with portion control discussed, as well as eating regularly and within a 12 hour window most days. The need to choose "clean , green" food 50 to 75% of the time is discussed, as well as to make water the primary drink and set a goal of 64 ounces water daily.    Weight /BMI 09/03/2020 07/03/2020 02/28/2020  WEIGHT 207 lb 209 lb 208 lb  HEIGHT 5\' 7"  5\' 7"  5\' 7"   BMI 32.42 kg/m2 32.73 kg/m2 32.58 kg/m2    Gradually losing weight which is excellent Now commited to exercise 5 days/ week  Essential hypertension DASH diet and commitment to daily physical activity for a minimum of 30 minutes discussed and encouraged, as a part of hypertension management. The importance  of attaining a healthy weight is also discussed.  BP/Weight 09/03/2020 07/03/2020 02/28/2020 11/01/2019 10/05/2019 06/08/2019 00/12/3816  Systolic BP - 299 371 696 789 381 017  Diastolic BP - 87 80 80 90 89 78  Wt. (Lbs) 207 209 208 210 211 218 217.4  BMI 32.42 32.73 32.58 32.89 33.05 35.19 42.46   Controlled when checked in office      Follow Up Instructions:    I discussed the assessment and treatment plan with the patient. The patient was provided an opportunity to ask questions and all were answered. The patient agreed with the plan and demonstrated an understanding of the instructions.   The patient was advised to call back or seek an in-person evaluation if the symptoms worsen or if the condition fails to improve as anticipated.  I provided 14 minutes of non-face-to-face time during this encounter.   Tula Nakayama, MD

## 2020-09-10 ENCOUNTER — Other Ambulatory Visit: Payer: Self-pay

## 2020-09-10 ENCOUNTER — Ambulatory Visit (HOSPITAL_COMMUNITY)
Admission: RE | Admit: 2020-09-10 | Discharge: 2020-09-10 | Disposition: A | Discharge status: Home / Self Care | Payer: BC Managed Care – PPO | Source: Ambulatory Visit | Attending: Family Medicine | Admitting: Family Medicine

## 2020-09-10 DIAGNOSIS — R7989 Other specified abnormal findings of blood chemistry: Secondary | ICD-10-CM | POA: Diagnosis present

## 2020-09-10 DIAGNOSIS — E039 Hypothyroidism, unspecified: Secondary | ICD-10-CM

## 2020-10-06 ENCOUNTER — Other Ambulatory Visit: Payer: Self-pay | Admitting: Family Medicine

## 2020-11-07 ENCOUNTER — Telehealth: Payer: Self-pay | Admitting: Family Medicine

## 2020-11-07 NOTE — Telephone Encounter (Signed)
  MRN [id number]  Patient called switching from [name] to Nancy [name]. She said that Nancy [name] needs to be added to the Primary Care Provider in the physician portable for [facility] Ulla Gallo   From [facility] Do you mind calling the patient to let them know what to do.  Please put the phone note in the chart.  Thanks so much :   I called the pt to advise that Legrand Como was a PCP with [employer], and that she would need to call [employer] to change the PCP listed from [place] to [employer]

## 2020-12-02 ENCOUNTER — Other Ambulatory Visit: Payer: Self-pay | Admitting: Family Medicine

## 2021-01-03 ENCOUNTER — Encounter: Payer: Self-pay | Admitting: Nurse Practitioner

## 2021-01-03 ENCOUNTER — Other Ambulatory Visit: Payer: Self-pay

## 2021-01-03 ENCOUNTER — Ambulatory Visit: Payer: BC Managed Care – PPO | Admitting: Family Medicine

## 2021-01-03 ENCOUNTER — Ambulatory Visit: Payer: BC Managed Care – PPO | Admitting: Nurse Practitioner

## 2021-01-03 VITALS — BP 137/81 | HR 75 | Temp 98.9°F | Ht 67.0 in | Wt 219.0 lb

## 2021-01-03 DIAGNOSIS — I10 Essential (primary) hypertension: Secondary | ICD-10-CM

## 2021-01-03 DIAGNOSIS — Z23 Encounter for immunization: Secondary | ICD-10-CM

## 2021-01-03 DIAGNOSIS — E039 Hypothyroidism, unspecified: Secondary | ICD-10-CM | POA: Diagnosis not present

## 2021-01-03 DIAGNOSIS — E782 Mixed hyperlipidemia: Secondary | ICD-10-CM | POA: Diagnosis not present

## 2021-01-03 DIAGNOSIS — G43109 Migraine with aura, not intractable, without status migrainosus: Secondary | ICD-10-CM | POA: Diagnosis not present

## 2021-01-03 MED ORDER — RIZATRIPTAN BENZOATE 10 MG PO TBDP
10.0000 mg | ORAL_TABLET | ORAL | 0 refills | Status: DC | PRN
Start: 2021-01-03 — End: 2024-03-22

## 2021-01-03 NOTE — Assessment & Plan Note (Signed)
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 maxalt -may consider nurtec if she fails maxalt

## 2021-01-03 NOTE — Assessment & Plan Note (Signed)
Lab Results  Component Value Date   TSH 0.096 (L) 08/27/2020   T4TOTAL 12.4 (H) 06/08/2019   -checking thyroid panel today

## 2021-01-03 NOTE — Patient Instructions (Signed)
Please have fasting labs drawn 2-3 days prior to your appointment so we can discuss the results during your office visit.  

## 2021-01-03 NOTE — Progress Notes (Signed)
Acute Office Visit  Subjective:    Patient ID: Nancy [name], female    DOB: [birth year], 55 y.o.   MRN: [id number]  Chief Complaint  Patient presents with   Hypothyroidism    Follow up    HPI Patient is in today for lab follow-up for hypothyroidism. Her TSH was 0.096 with her last draw.  She has been having headaches that are occurring more frequently. She states she used to have them once every 6 months, but now they are occurring more frequently 1-3 times per month. She has an aura that includes nausea and neck tightness. When her headache progresses, she has right-sided headache. She gets nausea and has light sensitivity and feels bette when she gets cold.  Past Medical History:  Diagnosis Date   Anxiety    Depression    Essential hypertension 05/01/2017   GERD (gastroesophageal reflux disease)    Hyperlipidemia 05/01/2017   Hypertension    Hypothyroidism, adult 03/02/2019   Irregular bleeding 03/10/2013   Thyroid disease    Phreesia 02/26/2020    Past Surgical History:  Procedure Laterality Date   ABDOMINAL HYSTERECTOMY     heavy irreg bleeding   CHOLECYSTECTOMY     COLONOSCOPY N/A 10/09/2016   Procedure: COLONOSCOPY;  Surgeon: Daneil Dolin, MD;  Location: [facility];  Service: Endoscopy;  Laterality: N/A;  9:30 AM   DILATION AND CURETTAGE OF UTERUS     ENDOMETRIAL ABLATION     SALPINGOOPHORECTOMY Bilateral 07/06/2013   Procedure: SALPINGO OOPHORECTOMY;  Surgeon: Florian Buff, MD;  Location: [facility] ORS;  Service: Gynecology;  Laterality: Bilateral;   SUPRACERVICAL ABDOMINAL HYSTERECTOMY N/A 07/06/2013   Procedure: HYSTERECTOMY SUPRACERVICAL ABDOMINAL;  Surgeon: Florian Buff, MD;  Location: [facility] OR[facility];  Service: Gynecology;  Laterality: N/A;   TUBAL LIGATION      Family History  Problem Relation Age of Onset   COPD Mother    Heart disease Mother        CHF   Hypertension Mother    Cancer Father        kidney met to lung   Heart attack Father 13       died of  heart attack   Hypertension Maternal Grandmother    Heart disease Maternal Grandmother    Rheum arthritis Maternal Grandmother    Hypertension Maternal Grandfather    Heart disease Maternal Grandfather    Hypertension Paternal Grandmother    Heart disease Paternal Grandmother    Hypertension Paternal Grandfather    Heart disease Paternal Grandfather    Cancer Sister 82       lung,renal   Alcohol abuse Sister     Social History   Socioeconomic History   Marital status: Divorced    Spouse name: Not on file   Number of children: 2   Years of education: 16   Highest education level: Bachelor's degree (e.g., BA, AB, BS)  Occupational History   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 Second marriage.[employer] officer, museum.   2 daughters grown in [place]   7 grand  and 1 great       Dog: LabDoodley: [place]      Enjoys: cruising, camp- being outside      Diet: eats all food groups    Caffeine: coffee- 16 oz; 16 oz   Water: 6-8 cups       Wears seat belt   Does not use phone while driving   Smoke Designer, television/film set in lock box   Social Determinants of Radio broadcast assistant Strain: Low Risk    Difficulty of Paying Living Expenses: Not very hard  Food Insecurity: No Food Insecurity   Worried About Charity fundraiser in the Last Year: Never true   Arboriculturist in the Last Year: Never true  Transportation Needs: No Transportation Needs   Lack of Transportation (Medical): No   Lack of Transportation (Non-Medical): No  Physical Activity: Insufficiently Active   Days of Exercise per Week: 4  days   Minutes of Exercise per Session: 30 min  Stress: No Stress Concern Present   Feeling of Stress : Only a little  Social Connections: Moderately Isolated   Frequency of Communication with Friends and Family: Three times a week   Frequency of Social Gatherings with Friends and Family: Three times a week   Attends Religious Services: 1 to 4 times per year   Active Member of Clubs or Organizations: No   Attends Archivist Meetings: Never   Marital Status: Divorced  Human resources officer Violence: Not At Risk   Fear of Current or Ex-Partner: No   Emotionally Abused: No   Physically Abused: No   Sexually Abused: No    Outpatient Medications Prior to Visit  Medication Sig Dispense Refill   Cholecalciferol (VITAMIN D-3) 125 MCG (5000 UT) TABS Take 2 tablets by mouth daily.     hydrochlorothiazide (HYDRODIURIL) 25 MG tablet TAKE 1 TABLET BY MOUTH DAILY. 90 tablet 0   levothyroxine (SYNTHROID) 75 MCG tablet TAKE (1) TABLET BY MOUTH ONCE A DAY. 30 tablet 0   No facility-administered medications prior to visit.    No Known Allergies  Review of Systems  Constitutional: Negative.   Respiratory: Negative.    Cardiovascular: Negative.   Musculoskeletal: Negative.   Neurological:  Positive for headaches.  Psychiatric/Behavioral: Negative.        Objective:    Physical Exam Constitutional:      Appearance: Normal appearance.  Cardiovascular:     Rate and Rhythm: Normal rate and regular rhythm.     Pulses: Normal pulses.     Heart sounds: Normal heart sounds.  Pulmonary:     Effort: Pulmonary effort is normal.     Breath sounds: Normal breath sounds.  Musculoskeletal:        General: Normal range of motion.  Neurological:     General: No focal deficit present.     Mental Status: She is alert and oriented to person, place, and time.     Cranial Nerves: No cranial nerve deficit.     Motor: No weakness.  Psychiatric:        Mood and Affect: Mood normal.         Behavior: Behavior normal.        Thought Content: Thought content normal.        Judgment: Judgment normal.    BP 137/81 (BP Location: Left Arm, Patient Position: Sitting, Cuff Size: Large)   Pulse 75   Temp 98.9 F (37.2 C) (Oral)  Ht 5' 7"  (1.702 m)   Wt 219 lb (99.3 kg)   LMP 07/06/2013 Comment: serum pregnancy negative on 06/30/2013  SpO2 98%   BMI 34.30 kg/m  Wt Readings from Last 3 Encounters:  01/03/21 219 lb (99.3 kg)  09/03/20 207 lb (93.9 kg)  07/03/20 209 lb (94.8 kg)    Health Maintenance Due  Topic Date Due   Zoster Vaccines- Shingrix (1 of 2) Never done   INFLUENZA VACCINE  11/26/2020    There are no preventive care reminders to display for this patient.   Lab Results  Component Value Date   TSH 0.096 (L) 08/27/2020   Lab Results  Component Value Date   WBC 6.3 07/04/2020   HGB 13.9 07/04/2020   HCT 39.7 07/04/2020   MCV 87 07/04/2020   PLT 278 07/04/2020   Lab Results  Component Value Date   NA 141 07/04/2020   K 4.7 07/04/2020   CO2 25 07/04/2020   GLUCOSE 99 07/04/2020   BUN 10 07/04/2020   CREATININE 0.74 07/04/2020   BILITOT 0.4 07/04/2020   ALKPHOS 86 07/04/2020   AST 24 07/04/2020   ALT 35 (H) 07/04/2020   PROT 6.5 07/04/2020   ALBUMIN 4.7 07/04/2020   CALCIUM 9.5 07/04/2020   ANIONGAP 8 02/01/2017   EGFR 96 07/04/2020   Lab Results  Component Value Date   CHOL 196 07/04/2020   Lab Results  Component Value Date   HDL 58 07/04/2020   Lab Results  Component Value Date   LDLCALC 128 (H) 07/04/2020   Lab Results  Component Value Date   TRIG 52 07/04/2020   Lab Results  Component Value Date   CHOLHDL 3.4 07/04/2020   Lab Results  Component Value Date   HGBA1C 5.5 07/04/2020       Assessment & Plan:   Problem List Items Addressed This Visit       Cardiovascular and Mediastinum   Essential hypertension    BP Readings from Last 3 Encounters:  01/03/21 137/81  07/03/20 133/87  02/28/20 134/80  -continue  HCTZ; check electrolytes today      Relevant Orders   CBC with Differential/Platelet   CMP14+EGFR   Lipid Panel With LDL/HDL Ratio   CBC with Differential/Platelet   CMP14+EGFR   Lipid Panel With LDL/HDL Ratio   Migraine with aura    -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 maxalt -may consider nurtec if she fails maxalt      Relevant Medications   rizatriptan (MAXALT-MLT) 10 MG disintegrating tablet     Endocrine   Hypothyroidism, adult - Primary    Lab Results  Component Value Date   TSH 0.096 (L) 08/27/2020   T4TOTAL 12.4 (H) 06/08/2019  -checking thyroid panel today      Relevant Orders   TSH + free T4   TSH + free T4     Other   Hyperlipidemia    Lab Results  Component Value Date   CHOL 196 07/04/2020   HDL 58 07/04/2020   LDLCALC 128 (H) 07/04/2020   TRIG 52 07/04/2020   CHOLHDL 3.4 07/04/2020  -checking lipids      Relevant Orders   CBC with Differential/Platelet   CMP14+EGFR   Lipid Panel With LDL/HDL Ratio   CBC with Differential/Platelet   CMP14+EGFR   Lipid Panel With LDL/HDL  Ratio     Meds ordered this encounter  Medications   rizatriptan (MAXALT-MLT) 10 MG disintegrating tablet    Sig: Take 1 tablet (10 mg total) by mouth as needed for migraine. May repeat in 2 hours if needed. Max of 2 tablets in 24 hour period.    Dispense:  10 tablet    Refill:  0     Noreene Larsson, NP

## 2021-01-03 NOTE — Assessment & Plan Note (Signed)
Lab Results  Component Value Date   CHOL 196 07/04/2020   HDL 58 07/04/2020   LDLCALC 128 (H) 07/04/2020   TRIG 52 07/04/2020   CHOLHDL 3.4 07/04/2020   -checking lipids

## 2021-01-03 NOTE — Assessment & Plan Note (Signed)
BP Readings from Last 3 Encounters:  01/03/21 137/81  07/03/20 133/87  02/28/20 134/80   -continue HCTZ; check electrolytes today

## 2021-01-04 NOTE — Progress Notes (Signed)
Labs are looking good. TSH is slightly elevated, and T4 hasn't resulted yet. NO need to change meds, but we will recheck labs again in a few months.

## 2021-01-05 LAB — CMP14+EGFR
ALT: 34 IU/L — ABNORMAL HIGH (ref 0–32)
AST: 24 IU/L (ref 0–40)
Albumin/Globulin Ratio: 3.1 — ABNORMAL HIGH (ref 1.2–2.2)
Albumin: 4.7 g/dL (ref 3.8–4.9)
Alkaline Phosphatase: 67 IU/L (ref 44–121)
BUN/Creatinine Ratio: 15 (ref 9–23)
BUN: 10 mg/dL (ref 6–24)
Bilirubin Total: 0.5 mg/dL (ref 0.0–1.2)
CO2: 23 mmol/L (ref 20–29)
Calcium: 9.5 mg/dL (ref 8.7–10.2)
Chloride: 99 mmol/L (ref 96–106)
Creatinine, Ser: 0.65 mg/dL (ref 0.57–1.00)
Globulin, Total: 1.5 g/dL (ref 1.5–4.5)
Glucose: 89 mg/dL (ref 65–99)
Potassium: 3.8 mmol/L (ref 3.5–5.2)
Sodium: 138 mmol/L (ref 134–144)
Total Protein: 6.2 g/dL (ref 6.0–8.5)
eGFR: 105 mL/min/{1.73_m2} (ref >59)

## 2021-01-05 LAB — CBC WITH DIFFERENTIAL/PLATELET
Basophils Absolute: 0 10*3/uL (ref 0.0–0.2)
Basos: 1 %
EOS (ABSOLUTE): 0.2 10*3/uL (ref 0.0–0.4)
Eos: 2 %
Hematocrit: 37.5 % (ref 34.0–46.6)
Hemoglobin: 13.1 g/dL (ref 11.1–15.9)
Immature Grans (Abs): 0 10*3/uL (ref 0.0–0.1)
Immature Granulocytes: 0 %
Lymphocytes Absolute: 3.3 10*3/uL — ABNORMAL HIGH (ref 0.7–3.1)
Lymphs: 44 %
MCH: 31.4 pg (ref 26.6–33.0)
MCHC: 34.9 g/dL (ref 31.5–35.7)
MCV: 90 fL (ref 79–97)
Monocytes Absolute: 0.5 10*3/uL (ref 0.1–0.9)
Monocytes: 7 %
Neutrophils Absolute: 3.4 10*3/uL (ref 1.4–7.0)
Neutrophils: 46 %
Platelets: 301 10*3/uL (ref 150–450)
RBC: 4.17 x10E6/uL (ref 3.77–5.28)
RDW: 13.4 % (ref 11.7–15.4)
WBC: 7.5 10*3/uL (ref 3.4–10.8)

## 2021-01-05 LAB — TSH+FREE T4
Free T4: 1.28 ng/dL (ref 0.82–1.77)
TSH: 4.61 u[IU]/mL — ABNORMAL HIGH (ref 0.450–4.500)

## 2021-01-05 LAB — LIPID PANEL WITH LDL/HDL RATIO
Cholesterol, Total: 198 mg/dL (ref 100–199)
HDL: 52 mg/dL (ref >39)
LDL Chol Calc (NIH): 115 mg/dL — ABNORMAL HIGH (ref 0–99)
LDL/HDL Ratio: 2.2 ratio (ref 0.0–3.2)
Triglycerides: 179 mg/dL — ABNORMAL HIGH (ref 0–149)
VLDL Cholesterol Cal: 31 mg/dL (ref 5–40)

## 2021-01-06 ENCOUNTER — Other Ambulatory Visit: Payer: Self-pay | Admitting: Nurse Practitioner

## 2021-02-03 ENCOUNTER — Other Ambulatory Visit: Payer: Self-pay | Admitting: Family Medicine

## 2021-02-22 ENCOUNTER — Encounter: Payer: Self-pay | Admitting: Nurse Practitioner

## 2021-03-01 ENCOUNTER — Other Ambulatory Visit (HOSPITAL_COMMUNITY): Payer: Self-pay | Admitting: Nurse Practitioner

## 2021-03-01 DIAGNOSIS — Z1231 Encounter for screening mammogram for malignant neoplasm of breast: Secondary | ICD-10-CM

## 2021-03-12 ENCOUNTER — Encounter: Payer: Self-pay | Admitting: Nurse Practitioner

## 2021-03-29 LAB — LIPID PANEL WITH LDL/HDL RATIO
Cholesterol, Total: 227 mg/dL — ABNORMAL HIGH (ref 100–199)
HDL: 61 mg/dL (ref >39)
LDL Chol Calc (NIH): 149 mg/dL — ABNORMAL HIGH (ref 0–99)
LDL/HDL Ratio: 2.4 ratio (ref 0.0–3.2)
Triglycerides: 95 mg/dL (ref 0–149)
VLDL Cholesterol Cal: 17 mg/dL (ref 5–40)

## 2021-03-29 LAB — CMP14+EGFR
ALT: 33 IU/L — ABNORMAL HIGH (ref 0–32)
AST: 29 IU/L (ref 0–40)
Albumin/Globulin Ratio: 2.4 — ABNORMAL HIGH (ref 1.2–2.2)
Albumin: 4.6 g/dL (ref 3.8–4.9)
Alkaline Phosphatase: 69 IU/L (ref 44–121)
BUN/Creatinine Ratio: 10 (ref 9–23)
BUN: 8 mg/dL (ref 6–24)
Bilirubin Total: 0.6 mg/dL (ref 0.0–1.2)
CO2: 25 mmol/L (ref 20–29)
Calcium: 9.6 mg/dL (ref 8.7–10.2)
Chloride: 101 mmol/L (ref 96–106)
Creatinine, Ser: 0.8 mg/dL (ref 0.57–1.00)
Globulin, Total: 1.9 g/dL (ref 1.5–4.5)
Glucose: 95 mg/dL (ref 70–99)
Potassium: 4.4 mmol/L (ref 3.5–5.2)
Sodium: 140 mmol/L (ref 134–144)
Total Protein: 6.5 g/dL (ref 6.0–8.5)
eGFR: 88 mL/min/{1.73_m2} (ref >59)

## 2021-03-29 LAB — CBC WITH DIFFERENTIAL/PLATELET
Basophils Absolute: 0.1 10*3/uL (ref 0.0–0.2)
Basos: 1 %
EOS (ABSOLUTE): 0.3 10*3/uL (ref 0.0–0.4)
Eos: 4 %
Hematocrit: 43.9 % (ref 34.0–46.6)
Hemoglobin: 14.7 g/dL (ref 11.1–15.9)
Immature Grans (Abs): 0 10*3/uL (ref 0.0–0.1)
Immature Granulocytes: 0 %
Lymphocytes Absolute: 2.6 10*3/uL (ref 0.7–3.1)
Lymphs: 38 %
MCH: 30.1 pg (ref 26.6–33.0)
MCHC: 33.5 g/dL (ref 31.5–35.7)
MCV: 90 fL (ref 79–97)
Monocytes Absolute: 0.4 10*3/uL (ref 0.1–0.9)
Monocytes: 6 %
Neutrophils Absolute: 3.4 10*3/uL (ref 1.4–7.0)
Neutrophils: 51 %
Platelets: 268 10*3/uL (ref 150–450)
RBC: 4.88 x10E6/uL (ref 3.77–5.28)
RDW: 12.1 % (ref 11.7–15.4)
WBC: 6.9 10*3/uL (ref 3.4–10.8)

## 2021-03-29 LAB — TSH+FREE T4
Free T4: 0.53 ng/dL — ABNORMAL LOW (ref 0.82–1.77)
TSH: 34.1 u[IU]/mL — ABNORMAL HIGH (ref 0.450–4.500)

## 2021-04-04 ENCOUNTER — Ambulatory Visit: Payer: BC Managed Care – PPO | Admitting: Nurse Practitioner

## 2021-04-04 ENCOUNTER — Encounter: Payer: Self-pay | Admitting: Nurse Practitioner

## 2021-04-04 ENCOUNTER — Other Ambulatory Visit: Payer: Self-pay

## 2021-04-04 VITALS — BP 160/99 | HR 71 | Temp 98.4°F | Resp 16 | Ht 67.0 in | Wt 218.1 lb

## 2021-04-04 DIAGNOSIS — R002 Palpitations: Secondary | ICD-10-CM | POA: Diagnosis not present

## 2021-04-04 DIAGNOSIS — I10 Essential (primary) hypertension: Secondary | ICD-10-CM | POA: Diagnosis not present

## 2021-04-04 DIAGNOSIS — E782 Mixed hyperlipidemia: Secondary | ICD-10-CM

## 2021-04-04 DIAGNOSIS — E039 Hypothyroidism, unspecified: Secondary | ICD-10-CM

## 2021-04-04 MED ORDER — LEVOTHYROXINE SODIUM 50 MCG PO TABS: 50.0000 ug | ORAL_TABLET | Freq: Every day | ORAL | 0 refills | Status: DC

## 2021-04-04 MED ORDER — ROSUVASTATIN CALCIUM 10 MG PO TABS: 10.0000 mg | ORAL_TABLET | Freq: Every day | ORAL | 3 refills | Status: DC

## 2021-04-04 MED ORDER — HYDROCHLOROTHIAZIDE 25 MG PO TABS: 25.0000 mg | ORAL_TABLET | Freq: Every day | ORAL | 1 refills | Status: DC

## 2021-04-04 NOTE — Assessment & Plan Note (Addendum)
Lab Results  Component Value Date   CHOL 227 (H) 03/28/2021   HDL 61 03/28/2021   LDLCALC 149 (H) 03/28/2021   TRIG 95 03/28/2021   CHOLHDL 3.4 07/04/2020   -LDL elevated -discussed statin therapy -Rx. rosuvastatin

## 2021-04-04 NOTE — Assessment & Plan Note (Signed)
-  resolved after stopping levothyroxine -had been super-therapeutic with thyroid meds in the past, so will restart levothyroxine at 50 mc instead of 52mcg -we discussed cutting back on caffeine -may consider cardiology consult if these recur -she should call us and get an appt if these happen again; would consider EKG and r/o A-fib if she has palpitations in the future -pt also has anxious affect

## 2021-04-04 NOTE — Assessment & Plan Note (Addendum)
Lab Results  Component Value Date   TSH 34.100 (H) 03/28/2021   T4TOTAL 12.4 (H) 06/08/2019   -Rx. Levothyroxine 50 mcg -was having palpitations at 75 mcg dose -will recheck labs in 1 month and consider titration at that point

## 2021-04-04 NOTE — Assessment & Plan Note (Signed)
BP Readings from Last 3 Encounters:  04/04/21 (!) 160/99  01/03/21 137/81  07/03/20 133/87   -BP elevated today, she had been nervous about taking HCTZ because her sister was recently diagnosed with cancer and her sister was taking HCTZ

## 2021-04-04 NOTE — Patient Instructions (Signed)
Please have labs drawn prior to your next appointment.

## 2021-04-04 NOTE — Progress Notes (Signed)
Acute Office Visit  Subjective:    Patient ID: Nancy [name], female    DOB: January 05, 1966, 55 y.o.   MRN: [id number]  Chief Complaint  Patient presents with   Hypothyroidism    Took herself off her thyroid medication. Felt much better after she stopped taking it in the first part of Nov. Started having symptoms of heart palpatations and hard to breathe and dizzy and pain in her left side of chest. Started noticing when she was walking her dog she was getting short of breath so she stopped her thyroid meds on a Friday and Saturday she walked the [place] trail with no issues or palpitations since    Hypertension    Has concern over her HCTZ since her sister was diagnosed with cancer and she takes the same med     HPI Patient is in today for lab follow-up for HLD and hypothyroidism.  She stopped taking thyroid medication at the first of November. She states she was having palpitations that resolved by stopping her levothyroxine.  Past Medical History:  Diagnosis Date   Anxiety    Depression    Essential hypertension 05/01/2017   GERD (gastroesophageal reflux disease)    Hyperlipidemia 05/01/2017   Hypertension    Hypothyroidism, adult 03/02/2019   Irregular bleeding 03/10/2013   Thyroid disease    Phreesia 02/26/2020    Past Surgical History:  Procedure Laterality Date   ABDOMINAL HYSTERECTOMY     heavy irreg bleeding   CHOLECYSTECTOMY     COLONOSCOPY N/A 10/09/2016   Procedure: COLONOSCOPY;  Surgeon: Daneil Dolin, MD;  Location: [facility];  Service: Endoscopy;  Laterality: N/A;  9:30 AM   DILATION AND CURETTAGE OF UTERUS     ENDOMETRIAL ABLATION     SALPINGOOPHORECTOMY Bilateral 07/06/2013   Procedure: SALPINGO OOPHORECTOMY;  Surgeon: Florian Buff, MD;  Location: [facility];  Service: Gynecology;  Laterality: Bilateral;   SUPRACERVICAL ABDOMINAL HYSTERECTOMY N/A 07/06/2013   Procedure: HYSTERECTOMY SUPRACERVICAL ABDOMINAL;  Surgeon: Florian Buff, MD;  Location: [facility];  Service: Gynecology;  Laterality: N/A;   TUBAL LIGATION      Family History  Problem Relation Age of Onset   COPD Mother    Heart disease Mother        CHF   Hypertension Mother    Cancer Father        kidney met to lung   Heart attack Father 68       died of heart attack   Hypertension Maternal Grandmother    Heart disease Maternal Grandmother    Rheum arthritis Maternal Grandmother    Hypertension Maternal Grandfather    Heart disease Maternal Grandfather    Hypertension Paternal Grandmother    Heart disease Paternal Grandmother    Hypertension Paternal Grandfather    Heart disease Paternal Grandfather    Cancer Sister 47       lung,renal   Alcohol abuse Sister     Social History   Socioeconomic History   Marital status: Divorced    Spouse name: Not on file   Number of children: 2   Years of education: 16   Highest education level: Bachelor's degree (e.g., BA, AB, BS)  Occupational History   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 Second marriage.[employer], [employer].   2 daughters grown in [place]   7 grand and 1 great       Dog: LabDoodley: Liberty      Enjoys: cruising, camp- being outside      Diet: eats all food groups    Caffeine: coffee- 16 oz; 16 oz   Water: 6-8 cups       Wears seat belt   Does not use phone while driving   Smoke Designer, television/film set in lock box   Social Determinants of Radio broadcast assistant Strain: Low Risk    Difficulty of Paying Living Expenses: Not very hard  Food Insecurity: No Food Insecurity   Worried About Charity fundraiser in  the Last Year: Never true   Arboriculturist in the Last Year: Never true  Transportation Needs: No Transportation Needs   Lack of Transportation (Medical): No   Lack of Transportation (Non-Medical): No  Physical Activity: Insufficiently Active   Days of Exercise per Week: 4 days   Minutes of Exercise per Session: 30 min  Stress: No Stress Concern Present   Feeling of Stress : Only a little  Social Connections: Moderately Isolated   Frequency of Communication with Friends and Family: Three times a week   Frequency of Social Gatherings with Friends and Family: Three times a week   Attends Religious Services: 1 to 4 times per year   Active Member of Clubs or Organizations: No   Attends Music therapist: Not on file   Marital Status: Divorced  Intimate Partner Violence: Not on file    Outpatient Medications Prior to Visit  Medication Sig Dispense Refill   Cholecalciferol (VITAMIN D-3) 125 MCG (5000 UT) TABS Take 2 tablets by mouth daily.     hydrochlorothiazide (HYDRODIURIL) 25 MG tablet TAKE 1 TABLET BY MOUTH DAILY. 90 tablet 0   rizatriptan (MAXALT-MLT) 10 MG disintegrating tablet Take 1 tablet (10 mg total) by mouth as needed for migraine. May repeat in 2 hours if needed. Max of 2 tablets in 24 hour period. (Patient not taking: Reported on 04/04/2021) 10 tablet 0   levothyroxine (SYNTHROID) 75 MCG tablet TAKE (1) TABLET BY MOUTH ONCE A DAY. 30 tablet 0   No facility-administered medications prior to visit.    No Known Allergies  Review of Systems  Constitutional: Negative.   Respiratory: Negative.    Cardiovascular:  Positive for palpitations.       Resolved after stopping levothyroxine  Endocrine: Negative.   Psychiatric/Behavioral: Negative.        Objective:    Physical Exam Constitutional:      Appearance: Normal appearance.  Cardiovascular:     Rate and Rhythm: Normal rate and regular rhythm.     Pulses: Normal pulses.     Heart sounds: Normal heart  sounds.  Pulmonary:     Effort: Pulmonary effort is normal.     Breath sounds: Normal breath sounds.  Neurological:     Mental Status: She is alert.  Psychiatric:        Mood and Affect: Mood normal.        Behavior: Behavior normal.        Thought Content: Thought content normal.  Judgment: Judgment normal.    BP (!) 160/99   Pulse 71   Temp 98.4 F (36.9 C) (Oral)   Resp 16   Ht _0  (1.702 m)   Wt 218 lb 1.9 oz (98.9 kg)   LMP 07/06/2013 Comment: serum pregnancy negative on 06/30/2013  SpO2 98%   BMI 34.16 kg/m  Wt Readings from Last 3 Encounters:  04/04/21 218 lb 1.9 oz (98.9 kg)  01/03/21 219 lb (99.3 kg)  09/03/20 207 lb (93.9 kg)    Health Maintenance Due  Topic Date Due   Zoster Vaccines- Shingrix (1 of 2) Never done   COVID-19 Vaccine (4 - Booster for Moderna series) 04/24/2020    There are no preventive care reminders to display for this patient.   Lab Results  Component Value Date   TSH 34.100 (H) 03/28/2021   Lab Results  Component Value Date   WBC 6.9 03/28/2021   HGB 14.7 03/28/2021   HCT 43.9 03/28/2021   MCV 90 03/28/2021   PLT 268 03/28/2021   Lab Results  Component Value Date   NA 140 03/28/2021   K 4.4 03/28/2021   CO2 25 03/28/2021   GLUCOSE 95 03/28/2021   BUN 8 03/28/2021   CREATININE 0.80 03/28/2021   BILITOT 0.6 03/28/2021   ALKPHOS 69 03/28/2021   AST 29 03/28/2021   ALT 33 (H) 03/28/2021   PROT 6.5 03/28/2021   ALBUMIN 4.6 03/28/2021   CALCIUM 9.6 03/28/2021   ANIONGAP 8 02/01/2017   EGFR 88 03/28/2021   Lab Results  Component Value Date   CHOL 227 (H) 03/28/2021   Lab Results  Component Value Date   HDL 61 03/28/2021   Lab Results  Component Value Date   LDLCALC 149 (H) 03/28/2021   Lab Results  Component Value Date   TRIG 95 03/28/2021   Lab Results  Component Value Date   CHOLHDL 3.4 07/04/2020   Lab Results  Component Value Date   HGBA1C 5.5 07/04/2020       Assessment & Plan:    Problem List Items Addressed This Visit       Cardiovascular and Mediastinum   Essential hypertension    BP Readings from Last 3 Encounters:  04/04/21 (!) 160/99  01/03/21 137/81  07/03/20 133/87  -BP elevated today, she had been nervous about taking HCTZ because her sister was recently diagnosed with cancer and her sister was taking HCTZ      Relevant Medications   rosuvastatin (CRESTOR) 10 MG tablet     Endocrine   Hypothyroidism, adult - Primary    Lab Results  Component Value Date   TSH 34.100 (H) 03/28/2021   T4TOTAL 12.4 (H) 06/08/2019  -Rx. Levothyroxine 50 mcg -was having palpitations at 75 mcg dose -will recheck labs in 1 month and consider titration at that point       Relevant Medications   levothyroxine (SYNTHROID) 50 MCG tablet   Other Relevant Orders   TSH + free T4     Other   Hyperlipidemia    Lab Results  Component Value Date   CHOL 227 (H) 03/28/2021   HDL 61 03/28/2021   LDLCALC 149 (H) 03/28/2021   TRIG 95 03/28/2021   CHOLHDL 3.4 07/04/2020  -LDL elevated -discussed statin therapy -Rx. rosuvastatin      Relevant Medications   rosuvastatin (CRESTOR) 10 MG tablet   Palpitations    -resolved after stopping levothyroxine -had been super-therapeutic with thyroid meds in the past, so will  restart levothyroxine at 50 mc instead of 35mg -we discussed cutting back on caffeine -may consider cardiology consult if these recur -[place] an [place] if these happen again; would consider EKG and r/o A-fib if she has palpitations in the future -pt also has anxious affect        Meds ordered this encounter  Medications   levothyroxine (SYNTHROID) 50 MCG tablet    Sig: Take 1 tablet (50 mcg total) by mouth daily.    Dispense:  30 tablet    Refill:  0   rosuvastatin (CRESTOR) 10 MG tablet    Sig: Take 1 tablet (10 mg total) by mouth daily.    Dispense:  90 tablet    Refill:  [place] NP

## 2021-04-18 ENCOUNTER — Other Ambulatory Visit (HOSPITAL_COMMUNITY): Payer: Self-pay | Admitting: General Practice

## 2021-04-18 ENCOUNTER — Ambulatory Visit (HOSPITAL_COMMUNITY)
Admission: RE | Admit: 2021-04-18 | Discharge: 2021-04-18 | Disposition: A | Discharge status: Home / Self Care | Payer: BC Managed Care – PPO | Source: Ambulatory Visit | Attending: Nurse Practitioner | Admitting: Nurse Practitioner

## 2021-04-18 ENCOUNTER — Other Ambulatory Visit: Payer: Self-pay

## 2021-04-18 ENCOUNTER — Other Ambulatory Visit: Payer: Self-pay | Admitting: Nurse Practitioner

## 2021-04-18 ENCOUNTER — Telehealth: Payer: Self-pay

## 2021-04-18 DIAGNOSIS — Z1231 Encounter for screening mammogram for malignant neoplasm of breast: Secondary | ICD-10-CM | POA: Insufficient documentation

## 2021-04-18 DIAGNOSIS — R928 Other abnormal and inconclusive findings on diagnostic imaging of breast: Secondary | ICD-10-CM

## 2021-04-18 NOTE — Telephone Encounter (Signed)
error 

## 2021-04-18 NOTE — Progress Notes (Signed)
I reviewed her mammogram results. In the left breast, there was an asymmetry taht the radiologist wants to investigate further. In the right breast, no findings suspicious for malignancy.  Their result notes states that they will set up further imaging with you. If you haven't heard from them in the next week, give [place] a call and I will place the orders. Not doing it today, since their note says they will do it.

## 2021-04-19 ENCOUNTER — Telehealth: Payer: Self-pay | Admitting: Internal Medicine

## 2021-04-19 ENCOUNTER — Encounter: Payer: Self-pay | Admitting: Nurse Practitioner

## 2021-04-19 NOTE — Telephone Encounter (Signed)
This is a gray pt please route accordingly

## 2021-04-19 NOTE — Telephone Encounter (Signed)
Return call for mammo results

## 2021-04-23 ENCOUNTER — Encounter (HOSPITAL_COMMUNITY): Payer: Self-pay

## 2021-04-23 ENCOUNTER — Ambulatory Visit (HOSPITAL_COMMUNITY)
Admission: RE | Admit: 2021-04-23 | Discharge: 2021-04-23 | Disposition: A | Discharge status: Home / Self Care | Payer: BC Managed Care – PPO | Source: Ambulatory Visit | Attending: Nurse Practitioner | Admitting: Nurse Practitioner

## 2021-04-23 ENCOUNTER — Other Ambulatory Visit: Payer: Self-pay

## 2021-04-23 DIAGNOSIS — R928 Other abnormal and inconclusive findings on diagnostic imaging of breast: Secondary | ICD-10-CM | POA: Insufficient documentation

## 2021-04-23 NOTE — Progress Notes (Signed)
Mammogram was great. Repeat in 1 year

## 2021-05-01 ENCOUNTER — Other Ambulatory Visit: Payer: Self-pay | Admitting: Nurse Practitioner

## 2021-05-01 DIAGNOSIS — E039 Hypothyroidism, unspecified: Secondary | ICD-10-CM

## 2021-05-14 LAB — TSH+FREE T4
Free T4: 0.91 ng/dL (ref 0.82–1.77)
TSH: 22.3 u[IU]/mL — ABNORMAL HIGH (ref 0.450–4.500)

## 2021-05-15 ENCOUNTER — Ambulatory Visit: Payer: BC Managed Care – PPO | Admitting: Internal Medicine

## 2021-05-15 ENCOUNTER — Encounter: Payer: Self-pay | Admitting: Internal Medicine

## 2021-05-15 ENCOUNTER — Other Ambulatory Visit: Payer: Self-pay

## 2021-05-15 VITALS — BP 132/94 | HR 84 | Resp 18 | Ht 67.0 in | Wt 221.0 lb

## 2021-05-15 DIAGNOSIS — E039 Hypothyroidism, unspecified: Secondary | ICD-10-CM | POA: Diagnosis not present

## 2021-05-15 DIAGNOSIS — R002 Palpitations: Secondary | ICD-10-CM | POA: Diagnosis not present

## 2021-05-15 DIAGNOSIS — E782 Mixed hyperlipidemia: Secondary | ICD-10-CM

## 2021-05-15 DIAGNOSIS — I10 Essential (primary) hypertension: Secondary | ICD-10-CM

## 2021-05-15 NOTE — Patient Instructions (Addendum)
Please continue taking medications as prescribed for now.  Please continue to follow DASH diet and perform moderate exercise/walking at least 150 mins/week.  Please get fasting blood tests done before the next visit.

## 2021-05-15 NOTE — Assessment & Plan Note (Signed)
BP Readings from Last 1 Encounters:  05/15/21 (!) 132/94   uncontrolled with HCTZ, could be related to recent stress Would avoid changing medication for now Advised to contact with home BP readings for 1 week If persistently elevated, will add ARB Counseled for compliance with the medications Advised DASH diet and moderate exercise/walking, at least 150 mins/week

## 2021-05-15 NOTE — Assessment & Plan Note (Addendum)
Lab Results  Component Value Date   TSH 22.300 (H) 05/13/2021   Down trending now, with normal free T4 She is symptomatically better, continue Levothyroxine 50 mcg QD for now She had palpitations with higher dose of Levothyroxine Will recheck TSH and free T4 later

## 2021-05-15 NOTE — Assessment & Plan Note (Signed)
On Crestor Lipid profile reviewed 

## 2021-05-15 NOTE — Assessment & Plan Note (Signed)
Resolved with lowering the dose of Levothyroxine to 50 mcg QD

## 2021-05-15 NOTE — Progress Notes (Signed)
Established Patient Office Visit  Subjective:  Patient ID: Nancy [name], female    DOB: June 14, 1965  Age: 56 y.o. MRN: [id number]  CC:  Chief Complaint  Patient presents with   Follow-up    1 month follow up     HPI Nancy [name] is a 56 y.o. female with past medical history of  HTN, HLD and hypothyroidism who presents for f/u of her chronic medical conditions.  HTN: BP is uncontrolled, but she states that she has been stressed today as she had tough day at school, where she works and she recently came to know about her sister's cancer. She takes medications regularly. Patient denies headache, dizziness, chest pain, dyspnea or palpitations.  Hypothyroidism: She has been doing well with levothyroxine 50 mcg now.  Her TSH is still elevated with normal free T4.  Her TSH is trending down now.  She does not have any palpitations now which she had with higher dose of levothyroxine.  Of note, she used to take NP thyroid in the past.  She denies any fatigue, recent change in weight or appetite or tremors currently.  I had lengthy discussion about her hypothyroidism and she agrees to stay on the same dose of levothyroxine for now with periodic check of TSH.  She takes Crestor for HLD.   Past Medical History:  Diagnosis Date   Anxiety    Depression    Essential hypertension 05/01/2017   GERD (gastroesophageal reflux disease)    Hyperlipidemia 05/01/2017   Hypertension    Hypothyroidism, adult 03/02/2019   Irregular bleeding 03/10/2013   Thyroid disease    Phreesia 02/26/2020    Past Surgical History:  Procedure Laterality Date   ABDOMINAL HYSTERECTOMY     heavy irreg bleeding   CHOLECYSTECTOMY     COLONOSCOPY N/A 10/09/2016   Procedure: COLONOSCOPY;  Surgeon: Daneil Dolin, MD;  Location: [facility];  Service: Endoscopy;  Laterality: N/A;  9:30 AM   DILATION AND CURETTAGE OF UTERUS     ENDOMETRIAL ABLATION     SALPINGOOPHORECTOMY Bilateral 07/06/2013   Procedure: SALPINGO  OOPHORECTOMY;  Surgeon: Florian Buff, MD;  Location: [facility];  Service: Gynecology;  Laterality: Bilateral;   SUPRACERVICAL ABDOMINAL HYSTERECTOMY N/A 07/06/2013   Procedure: HYSTERECTOMY SUPRACERVICAL ABDOMINAL;  Surgeon: Florian Buff, MD;  Location: [facility];  Service: Gynecology;  Laterality: N/A;   TUBAL LIGATION      Family History  Problem Relation Age of Onset   COPD Mother    Heart disease Mother        CHF   Hypertension Mother    Cancer Father        kidney met to lung   Heart attack Father 71       died of heart attack   Hypertension Maternal Grandmother    Heart disease Maternal Grandmother    Rheum arthritis Maternal Grandmother    Hypertension Maternal Grandfather    Heart disease Maternal Grandfather    Hypertension Paternal Grandmother    Heart disease Paternal Grandmother    Hypertension Paternal Grandfather    Heart disease Paternal Grandfather    Cancer Sister 18       lung,renal   Alcohol abuse Sister     Social History   Socioeconomic History   Marital status: Divorced    Spouse name: Not on file   Number of children: 2   Years of education: 16   Highest education level: Bachelor's degree (e.g., BA, AB, BS)  Occupational History   Occupation: Product manager: [employer], [employer]    Comment: reading specialist   Tobacco Use   Smoking status: Never   Smokeless tobacco: Never  Substance and Sexual Activity   Alcohol use: No   Drug use: No   Sexual activity: Yes    Birth control/protection: Surgical  Other Topics Concern   Not on file  Social History Narrative   Lives w husbandMarried for 3 years but dating for 17 plus. Second marriage.[employer], [employer].   2 daughters grown in [place]   7 grand and 1 great       Dog: LabDoodley: [place]      Enjoys: cruising, camp- being outside      Diet: eats all food groups    Caffeine: coffee- 16 oz; 16 oz   Water: 6-8 cups       Wears seat belt   Does not use phone while driving   Smoke  Designer, television/film set in lock box   Social Determinants of Radio broadcast assistant Strain: Low Risk    Difficulty of Paying Living Expenses: Not very hard  Food Insecurity: No Food Insecurity   Worried About Charity fundraiser in the Last Year: Never true   Arboriculturist in the Last Year: Never true  Transportation Needs: No Transportation Needs   Lack of Transportation (Medical): No   Lack of Transportation (Non-Medical): No  Physical Activity: Insufficiently Active   Days of Exercise per Week: 4 days   Minutes of Exercise per Session: 30 min  Stress: No Stress Concern Present   Feeling of Stress : Only a little  Social Connections: Moderately Isolated   Frequency of Communication with Friends and Family: Three times a week   Frequency of Social Gatherings with Friends and Family: Three times a week   Attends Religious Services: 1 to 4 times per year   Active Member of Clubs or Organizations: No   Attends Music therapist: Not on file   Marital Status: Divorced  Intimate Partner Violence: Not on file    Outpatient Medications Prior to Visit  Medication Sig Dispense Refill   Cholecalciferol (VITAMIN D-3) 125 MCG (5000 UT) TABS Take 2 tablets by mouth daily.     hydrochlorothiazide (HYDRODIURIL) 25 MG tablet Take 1 tablet (25 mg total) by mouth daily. 90 tablet 1   levothyroxine (SYNTHROID) 50 MCG tablet TAKE ONE TABLET BY MOUTH ONCE DAILY. 30 tablet 0   rizatriptan (MAXALT-MLT) 10 MG disintegrating tablet Take 1 tablet (10 mg total) by mouth as needed for migraine. May repeat in 2 hours if needed. Max of 2 tablets in 24 hour period. 10 tablet 0   rosuvastatin (CRESTOR) 10 MG tablet Take 1 tablet (10 mg total) by mouth daily. 90 tablet 3   No facility-administered medications prior to visit.    No Known Allergies  ROS Review of Systems  Constitutional:  Negative for chills and fever.  HENT:  Negative for congestion, sinus pressure,  sinus pain and sore throat.   Eyes:  Negative for pain and discharge.  Respiratory:  Negative for cough and shortness of breath.   Cardiovascular:  Negative for chest pain and palpitations.  Gastrointestinal:  Negative for abdominal pain, constipation, diarrhea, nausea and vomiting.  Endocrine: Negative for polydipsia and polyuria.  Genitourinary:  Negative for dysuria and hematuria.  Musculoskeletal:  Negative for neck pain and neck stiffness.  Skin:  Negative  for rash.  Neurological:  Negative for dizziness and weakness.  Psychiatric/Behavioral:  Negative for agitation and behavioral problems.      Objective:    Physical Exam Vitals reviewed.  Constitutional:      General: She is not in acute distress.    Appearance: She is not diaphoretic.  HENT:     Head: Normocephalic and atraumatic.     Nose: Nose normal. No congestion.     Mouth/Throat:     Mouth: Mucous membranes are moist.     Pharynx: No posterior oropharyngeal erythema.  Eyes:     General: No scleral icterus.    Extraocular Movements: Extraocular movements intact.  Cardiovascular:     Rate and Rhythm: Normal rate and regular rhythm.     Pulses: Normal pulses.     Heart sounds: Normal heart sounds. No murmur heard. Pulmonary:     Breath sounds: Normal breath sounds. No wheezing or rales.  Musculoskeletal:     Cervical back: Neck supple. No tenderness.     Right lower leg: No edema.     Left lower leg: No edema.  Skin:    General: Skin is warm.     Findings: No rash.  Neurological:     General: No focal deficit present.     Mental Status: She is alert and oriented to person, place, and time.     Sensory: No sensory deficit.     Motor: No weakness.  Psychiatric:        Mood and Affect: Mood normal.        Behavior: Behavior normal.    BP (!) 132/94 (BP Location: Right Arm, Cuff Size: Normal)    Pulse 84    Resp 18    Ht 5' 7"  (1.702 m)    Wt 221 lb 0.6 oz (100.3 kg)    LMP 07/06/2013 Comment: serum  pregnancy negative on 06/30/2013   SpO2 99%    BMI 34.62 kg/m  Wt Readings from Last 3 Encounters:  05/15/21 221 lb 0.6 oz (100.3 kg)  04/04/21 218 lb 1.9 oz (98.9 kg)  01/03/21 219 lb (99.3 kg)    Lab Results  Component Value Date   TSH 22.300 (H) 05/13/2021   Lab Results  Component Value Date   WBC 6.9 03/28/2021   HGB 14.7 03/28/2021   HCT 43.9 03/28/2021   MCV 90 03/28/2021   PLT 268 03/28/2021   Lab Results  Component Value Date   NA 140 03/28/2021   K 4.4 03/28/2021   CO2 25 03/28/2021   GLUCOSE 95 03/28/2021   BUN 8 03/28/2021   CREATININE 0.80 03/28/2021   BILITOT 0.6 03/28/2021   ALKPHOS 69 03/28/2021   AST 29 03/28/2021   ALT 33 (H) 03/28/2021   PROT 6.5 03/28/2021   ALBUMIN 4.6 03/28/2021   CALCIUM 9.6 03/28/2021   ANIONGAP 8 02/01/2017   EGFR 88 03/28/2021   Lab Results  Component Value Date   CHOL 227 (H) 03/28/2021   Lab Results  Component Value Date   HDL 61 03/28/2021   Lab Results  Component Value Date   LDLCALC 149 (H) 03/28/2021   Lab Results  Component Value Date   TRIG 95 03/28/2021   Lab Results  Component Value Date   CHOLHDL 3.4 07/04/2020   Lab Results  Component Value Date   HGBA1C 5.5 07/04/2020      Assessment & Plan:   Problem List Items Addressed This Visit       Cardiovascular  and Mediastinum   Essential hypertension    BP Readings from Last 1 Encounters:  05/15/21 (!) 132/94  uncontrolled with HCTZ, could be related to recent stress Would avoid changing medication for now Advised to contact with home BP readings for 1 week If persistently elevated, will add ARB Counseled for compliance with the medications Advised DASH diet and moderate exercise/walking, at least 150 mins/week       Relevant Orders   Basic Metabolic Panel (BMET)     Endocrine   Hypothyroidism, adult - Primary    Lab Results  Component Value Date   TSH 22.300 (H) 05/13/2021  Down trending now, with normal free T4 She is  symptomatically better, continue Levothyroxine 50 mcg QD for now She had palpitations with higher dose of Levothyroxine Will recheck TSH and free T4 later      Relevant Orders   TSH + free T4     Other   Hyperlipidemia    On Crestor Lipid profile reviewed      Palpitations    Resolved with lowering the dose of Levothyroxine to 50 mcg QD       No orders of the defined types were placed in this encounter.   Follow-up: Return in about 2 months (around 07/13/2021) for HTN and hypothyroidism.    Lindell Spar, MD

## 2021-05-21 ENCOUNTER — Ambulatory Visit: Payer: BC Managed Care – PPO

## 2021-05-21 ENCOUNTER — Ambulatory Visit: Payer: BC Managed Care – PPO | Admitting: Internal Medicine

## 2021-05-21 ENCOUNTER — Other Ambulatory Visit: Payer: Self-pay

## 2021-05-21 ENCOUNTER — Encounter: Payer: Self-pay | Admitting: Internal Medicine

## 2021-05-21 DIAGNOSIS — U071 COVID-19: Secondary | ICD-10-CM | POA: Diagnosis not present

## 2021-05-21 DIAGNOSIS — Z20822 Contact with and (suspected) exposure to covid-19: Secondary | ICD-10-CM

## 2021-05-21 MED ORDER — NIRMATRELVIR/RITONAVIR (PAXLOVID)TABLET: 3.0000 | ORAL_TABLET | Freq: Two times a day (BID) | ORAL | 0 refills | Status: AC

## 2021-05-21 NOTE — Progress Notes (Signed)
Virtual Visit via Telephone Note   This visit type was conducted due to national recommendations for restrictions regarding the COVID-19 Pandemic (e.g. social distancing) in an effort to limit this patient's exposure and mitigate transmission in our community.  Due to her co-morbid illnesses, this patient is at least at moderate risk for complications without adequate follow up.  This format is felt to be most appropriate for this patient at this time.  The patient did not have access to video technology/had technical difficulties with video requiring transitioning to audio format only (telephone).  All issues noted in this document were discussed and addressed.  No physical exam could be performed with this format.  Evaluation Performed:  Follow-up visit  Date:  05/21/2021   ID:  Nancy [name], DOB Feb 11, 1966, MRN [id number]  Patient Location: Home Provider Location: Office/Clinic  Participants: Patient Location of Patient: Home Location of Provider: Telehealth Consent was obtain for visit to be over via telehealth. I verified that I am speaking with the correct person using two identifiers.  PCP:  Lindell Spar, MD   Chief Complaint: Fever, myalgias and cough  History of Present Illness:    Nancy [name] is a 56 y.o. female who has a televisit for complaint of fever, cough, myalgias, headache and nasal congestion for the last 2 days.  She denies any dyspnea or wheezing currently.  She tested positive for COVID at home this morning.  The patient does have symptoms concerning for COVID-19 infection (fever, chills, cough, or new shortness of breath).   Past Medical, Surgical, Social History, Allergies, and Medications have been Reviewed.  Past Medical History:  Diagnosis Date   Anxiety    Depression    Essential hypertension 05/01/2017   GERD (gastroesophageal reflux disease)    Hyperlipidemia 05/01/2017   Hypertension    Hypothyroidism, adult 03/02/2019   Irregular  bleeding 03/10/2013   Thyroid disease    Phreesia 02/26/2020   Past Surgical History:  Procedure Laterality Date   ABDOMINAL HYSTERECTOMY     heavy irreg bleeding   CHOLECYSTECTOMY     COLONOSCOPY N/A 10/09/2016   Procedure: COLONOSCOPY;  Surgeon: Daneil Dolin, MD;  Location: [facility];  Service: Endoscopy;  Laterality: N/A;  9:30 AM   DILATION AND CURETTAGE OF UTERUS     ENDOMETRIAL ABLATION     SALPINGOOPHORECTOMY Bilateral 07/06/2013   Procedure: SALPINGO OOPHORECTOMY;  Surgeon: Florian Buff, MD;  Location: [facility];  Service: Gynecology;  Laterality: Bilateral;   SUPRACERVICAL ABDOMINAL HYSTERECTOMY N/A 07/06/2013   Procedure: HYSTERECTOMY SUPRACERVICAL ABDOMINAL;  Surgeon: Florian Buff, MD;  Location: [facility];  Service: Gynecology;  Laterality: N/A;   TUBAL LIGATION       Current Meds  Medication Sig   Cholecalciferol (VITAMIN D-3) 125 MCG (5000 UT) TABS Take 2 tablets by mouth daily.   hydrochlorothiazide (HYDRODIURIL) 25 MG tablet Take 1 tablet (25 mg total) by mouth daily.   levothyroxine (SYNTHROID) 50 MCG tablet TAKE ONE TABLET BY MOUTH ONCE DAILY.   rizatriptan (MAXALT-MLT) 10 MG disintegrating tablet Take 1 tablet (10 mg total) by mouth as needed for migraine. May repeat in 2 hours if needed. Max of 2 tablets in 24 hour period.   rosuvastatin (CRESTOR) 10 MG tablet Take 1 tablet (10 mg total) by mouth daily.     Allergies:   Patient has no known allergies.   ROS:   Please see the history of present illness.     All  other systems reviewed and are negative.   Labs/Other Tests and Data Reviewed:    Recent Labs: 03/28/2021: ALT 33; BUN 8; Creatinine, Ser 0.80; Hemoglobin 14.7; Platelets 268; Potassium 4.4; Sodium 140 05/13/2021: TSH 22.300   Recent Lipid Panel Lab Results  Component Value Date/Time   CHOL 227 (H) 03/28/2021 08:10 AM   TRIG 95 03/28/2021 08:10 AM   HDL 61 03/28/2021 08:10 AM   CHOLHDL 3.4 07/04/2020 08:03 AM   CHOLHDL 3.2 10/05/2019 01:51 PM    LDLCALC 149 (H) 03/28/2021 08:10 AM   LDLCALC 121 (H) 10/05/2019 01:51 PM    Wt Readings from Last 3 Encounters:  05/15/21 221 lb 0.6 oz (100.3 kg)  04/04/21 218 lb 1.9 oz (98.9 kg)  01/03/21 219 lb (99.3 kg)     ASSESSMENT & PLAN:    COVID 19 infection Started Paxlovid Advised to use Mucinex or Robitussin DM for cough Maintain adequate hydration Check COVID RT-PCR as she requires it for work  Time:   Today, I have spent 9 minutes reviewing the chart, including problem list, medications, and with the patient with telehealth technology discussing the above problems.   Medication Adjustments/Labs and Tests Ordered: Current medicines are reviewed at length with the patient today.  Concerns regarding medicines are outlined above.   Tests Ordered: No orders of the defined types were placed in this encounter.   Medication Changes: No orders of the defined types were placed in this encounter.    Note: This dictation was prepared with Dragon dictation along with smaller phrase technology. Similar sounding words can be transcribed inadequately or may not be corrected upon review. Any transcriptional errors that result from this process are unintentional.      Disposition:  Follow up  Signed, Lindell Spar, MD  05/21/2021 8:56 AM     [employer]

## 2021-05-21 NOTE — Telephone Encounter (Signed)
Pt scheduled for virtual visit with provider

## 2021-05-23 LAB — NOVEL CORONAVIRUS, NAA: SARS-CoV-2, NAA: DETECTED — AB

## 2021-05-23 LAB — SARS-COV-2, NAA 2 DAY TAT

## 2021-05-31 ENCOUNTER — Other Ambulatory Visit: Payer: Self-pay | Admitting: Internal Medicine

## 2021-05-31 DIAGNOSIS — E039 Hypothyroidism, unspecified: Secondary | ICD-10-CM

## 2021-06-29 ENCOUNTER — Other Ambulatory Visit: Payer: Self-pay | Admitting: Internal Medicine

## 2021-06-29 DIAGNOSIS — E039 Hypothyroidism, unspecified: Secondary | ICD-10-CM

## 2021-07-26 LAB — BASIC METABOLIC PANEL
BUN/Creatinine Ratio: 8 — ABNORMAL LOW (ref 9–23)
BUN: 6 mg/dL (ref 6–24)
CO2: 26 mmol/L (ref 20–29)
Calcium: 9.6 mg/dL (ref 8.7–10.2)
Chloride: 101 mmol/L (ref 96–106)
Creatinine, Ser: 0.77 mg/dL (ref 0.57–1.00)
Glucose: 93 mg/dL (ref 70–99)
Potassium: 4.8 mmol/L (ref 3.5–5.2)
Sodium: 140 mmol/L (ref 134–144)
eGFR: 91 mL/min/{1.73_m2} (ref >59)

## 2021-07-26 LAB — TSH+FREE T4
Free T4: 1.09 ng/dL (ref 0.82–1.77)
TSH: 14.6 u[IU]/mL — ABNORMAL HIGH (ref 0.450–4.500)

## 2021-07-31 ENCOUNTER — Ambulatory Visit: Payer: BC Managed Care – PPO | Admitting: Internal Medicine

## 2021-07-31 ENCOUNTER — Encounter: Payer: Self-pay | Admitting: Internal Medicine

## 2021-07-31 VITALS — BP 128/82 | HR 72 | Resp 18 | Ht 67.0 in | Wt 219.2 lb

## 2021-07-31 DIAGNOSIS — E039 Hypothyroidism, unspecified: Secondary | ICD-10-CM

## 2021-07-31 DIAGNOSIS — E559 Vitamin D deficiency, unspecified: Secondary | ICD-10-CM | POA: Diagnosis not present

## 2021-07-31 DIAGNOSIS — Z0000 Encounter for general adult medical examination without abnormal findings: Secondary | ICD-10-CM | POA: Diagnosis not present

## 2021-07-31 DIAGNOSIS — R002 Palpitations: Secondary | ICD-10-CM

## 2021-07-31 DIAGNOSIS — E782 Mixed hyperlipidemia: Secondary | ICD-10-CM

## 2021-07-31 DIAGNOSIS — I10 Essential (primary) hypertension: Secondary | ICD-10-CM

## 2021-07-31 MED ORDER — LEVOTHYROXINE SODIUM 75 MCG PO TABS: 75.0000 ug | ORAL_TABLET | Freq: Every day | ORAL | 1 refills | Status: DC

## 2021-07-31 NOTE — Assessment & Plan Note (Signed)
Lab Results  ?Component Value Date  ? TSH 14.600 (H) 07/25/2021  ? ?Down trending now, with normal free T4 ?She is symptomatically better, but since her TSH is still abnormal, will increase dose of Levothyroxine to 75 mcg QD for now ?Will recheck TSH and free T4 later ?

## 2021-07-31 NOTE — Assessment & Plan Note (Signed)
BP Readings from Last 1 Encounters:  ?07/31/21 128/82  ? ?Well-controlled with HCTZ ?Counseled for compliance with the medications ?Advised DASH diet and moderate exercise/walking, at least 150 mins/week ? ?

## 2021-07-31 NOTE — Patient Instructions (Signed)
Please start taking Levothyroxine 75 mcg once daily instead of 50 mcg. ? ?Please continue to take other medications as prescribed. ? ?Please continue to follow low salt diet and perform moderate exercise/walking at least 150 mins/week. ?

## 2021-07-31 NOTE — Assessment & Plan Note (Signed)
Resolved with lowering the dose of Levothyroxine to 50 mcg QD in the past ?Will monitor closely if it restarts as her dose had to be increased today ?

## 2021-07-31 NOTE — Assessment & Plan Note (Signed)
On Crestor Lipid profile reviewed 

## 2021-07-31 NOTE — Progress Notes (Signed)
? ?Established Patient Office Visit ? ?Subjective:  ?Patient ID: Nancy [name], female    DOB: 03/15/1966  Age: 56 y.o. MRN: [id number] ? ?CC:  ?Chief Complaint  ?Patient presents with  ? Follow-up  ?  2 month follow up HTN hypothyroidism   ? ? ?HPI ?Nancy [name] is a 56 y.o. female with past medical history of HTN, HLD and hypothyroidism who presents for f/u of her chronic medical conditions. ? ?HTN: BP is well-controlled. Takes medications regularly. Patient denies headache, dizziness, chest pain, dyspnea or palpitations. ? ?Hypothyroidism: She has been doing well with levothyroxine 50 mcg now.  Her TSH is still elevated with normal free T4.  Her TSH is trending down now.  She does not have any palpitations now which she had with higher dose of levothyroxine.  Of note, she used to take NP thyroid in the past.  She denies any fatigue, recent change in weight or appetite or tremors currently. She agrees to increase dose of Levothyroxine now as her TSH is still elevated. ? ? ? ?Past Medical History:  ?Diagnosis Date  ? Anxiety   ? Depression   ? Essential hypertension 05/01/2017  ? GERD (gastroesophageal reflux disease)   ? Hyperlipidemia 05/01/2017  ? Hypertension   ? Hypothyroidism, adult 03/02/2019  ? Irregular bleeding 03/10/2013  ? Thyroid disease   ? Phreesia 02/26/2020  ? ? ?Past Surgical History:  ?Procedure Laterality Date  ? ABDOMINAL HYSTERECTOMY    ? heavy irreg bleeding  ? CHOLECYSTECTOMY    ? COLONOSCOPY N/A 10/09/2016  ? Procedure: COLONOSCOPY;  Surgeon: Daneil Dolin, MD;  Location: [facility];  Service: Endoscopy;  Laterality: N/A;  9:30 AM  ? DILATION AND CURETTAGE OF UTERUS    ? ENDOMETRIAL ABLATION    ? SALPINGOOPHORECTOMY Bilateral 07/06/2013  ? Procedure: SALPINGO OOPHORECTOMY;  Surgeon: Florian Buff, MD;  Location: [facility];  Service: Gynecology;  Laterality: Bilateral;  ? SUPRACERVICAL ABDOMINAL HYSTERECTOMY N/A 07/06/2013  ? Procedure: HYSTERECTOMY SUPRACERVICAL ABDOMINAL;  Surgeon:  Florian Buff, MD;  Location: [facility];  Service: Gynecology;  Laterality: N/A;  ? TUBAL LIGATION    ? ? ?Family History  ?Problem Relation Age of Onset  ? COPD Mother   ? Heart disease Mother   ?     CHF  ? Hypertension Mother   ? Cancer Father   ?     kidney met to lung  ? Heart attack Father 73  ?     died of heart attack  ? Hypertension Maternal Grandmother   ? Heart disease Maternal Grandmother   ? Rheum arthritis Maternal Grandmother   ? Hypertension Maternal Grandfather   ? Heart disease Maternal Grandfather   ? Hypertension Paternal Grandmother   ? Heart disease Paternal Grandmother   ? Hypertension Paternal Grandfather   ? Heart disease Paternal Grandfather   ? Cancer Sister 46  ?     lung,renal  ? Alcohol abuse Sister   ? ? ?Social History  ? ?Socioeconomic History  ? Marital status: Divorced  ?  Spouse name: Not on file  ? Number of children: 2  ? Years of education: 73  ? Highest education level: Bachelor's degree (e.g., BA, AB, BS)  ?Occupational History  ? Occupation: Pharmacist, hospital  ?  Employer: [employer]  ?  Comment: reading specialist   ?Tobacco Use  ? Smoking status: Never  ? Smokeless tobacco: Never  ?Substance and Sexual Activity  ? Alcohol use: No  ? Drug  use: No  ? Sexual activity: Yes  ?  Birth control/protection: Surgical  ?Other Topics Concern  ? Not on file  ?Social History Narrative  ? Lives w husbandMarried for 3 years but dating for 17 plus. Second marriage.[place] [employer] officer, [employer].  ? 2 daughters grown in [place]  ? 7 grand and 1 great   ?   ? Dog: LabDoodley: [place]  ?   ? Enjoys: cruising, camp- being outside  ?   ? Diet: eats all food groups   ? Caffeine: coffee- 16 oz; 16 oz  ? Water: 6-8 cups   ?   ? Wears seat belt  ? Does not use phone while driving  ? Smoke detectors  ? Data processing manager  ? Weapons in lock box  ? ?Social Determinants of Health  ? ?Financial Resource Strain: Low Risk   ? Difficulty of Paying Living Expenses: Not very hard  ?Food Insecurity: No Food Insecurity  ?  Worried About Charity fundraiser in the Last Year: Never true  ? Ran Out of Food in the Last Year: Never true  ?Transportation Needs: No Transportation Needs  ? Lack of Transportation (Medical): No  ? Lack of Transportation (Non-Medical): No  ?Physical Activity: Insufficiently Active  ? Days of Exercise per Week: 4 days  ? Minutes of Exercise per Session: 30 min  ?Stress: No Stress Concern Present  ? Feeling of Stress : Only a little  ?Social Connections: Moderately Isolated  ? Frequency of Communication with Friends and Family: Three times a week  ? Frequency of Social Gatherings with Friends and Family: Three times a week  ? Attends Religious Services: 1 to 4 times per year  ? Active Member of Clubs or Organizations: No  ? Attends Archivist Meetings: Not on file  ? Marital Status: Divorced  ?Intimate Partner Violence: Not on file  ? ? ?Outpatient Medications Prior to Visit  ?Medication Sig Dispense Refill  ? Cholecalciferol (VITAMIN D-3) 125 MCG (5000 UT) TABS Take 2 tablets by mouth daily.    ? hydrochlorothiazide (HYDRODIURIL) 25 MG tablet Take 1 tablet (25 mg total) by mouth daily. 90 tablet 1  ? rizatriptan (MAXALT-MLT) 10 MG disintegrating tablet Take 1 tablet (10 mg total) by mouth as needed for migraine. May repeat in 2 hours if needed. Max of 2 tablets in 24 hour period. 10 tablet 0  ? rosuvastatin (CRESTOR) 10 MG tablet Take 1 tablet (10 mg total) by mouth daily. 90 tablet 3  ? levothyroxine (SYNTHROID) 50 MCG tablet TAKE ONE TABLET BY MOUTH ONCE DAILY. 30 tablet 0  ? ?No facility-administered medications prior to visit.  ? ? ?No Known Allergies ? ?ROS ?Review of Systems  ?Constitutional:  Negative for chills and fever.  ?HENT:  Negative for congestion, sinus pressure, sinus pain and sore throat.   ?Eyes:  Negative for pain and discharge.  ?Respiratory:  Negative for cough and shortness of breath.   ?Cardiovascular:  Negative for chest pain and palpitations.  ?Gastrointestinal:  Negative for  abdominal pain, constipation, diarrhea, nausea and vomiting.  ?Endocrine: Negative for polydipsia and polyuria.  ?Genitourinary:  Negative for dysuria and hematuria.  ?Musculoskeletal:  Negative for neck pain and neck stiffness.  ?Skin:  Negative for rash.  ?Neurological:  Negative for dizziness and weakness.  ?Psychiatric/Behavioral:  Negative for agitation and behavioral problems.   ? ?  ?Objective:  ?  ?Physical Exam ?Vitals reviewed.  ?Constitutional:   ?   General: She is not in acute  distress. ?   Appearance: She is not diaphoretic.  ?HENT:  ?   Head: Normocephalic and atraumatic.  ?   Nose: Nose normal. No congestion.  ?   Mouth/Throat:  ?   Mouth: Mucous membranes are moist.  ?   Pharynx: No posterior oropharyngeal erythema.  ?Eyes:  ?   General: No scleral icterus. ?   Extraocular Movements: Extraocular movements intact.  ?Cardiovascular:  ?   Rate and Rhythm: Normal rate and regular rhythm.  ?   Pulses: Normal pulses.  ?   Heart sounds: Normal heart sounds. No murmur heard. ?Pulmonary:  ?   Breath sounds: Normal breath sounds. No wheezing or rales.  ?Musculoskeletal:  ?   Cervical back: Neck supple. No tenderness.  ?   Right lower leg: No edema.  ?   Left lower leg: No edema.  ?Skin: ?   General: Skin is warm.  ?   Findings: No rash.  ?Neurological:  ?   General: No focal deficit present.  ?   Mental Status: She is alert and oriented to person, place, and time.  ?   Sensory: No sensory deficit.  ?   Motor: No weakness.  ?Psychiatric:     ?   Mood and Affect: Mood normal.     ?   Behavior: Behavior normal.  ? ? ?BP 128/82 (BP Location: Right Arm, Patient Position: Sitting, Cuff Size: Normal)   Pulse 72   Resp 18   Ht 5' 7" (1.702 m)   Wt 219 lb 3.2 oz (99.4 kg)   LMP 07/06/2013 Comment: serum pregnancy negative on 06/30/2013  SpO2 100%   BMI 34.33 kg/m?  ?Wt Readings from Last 3 Encounters:  ?07/31/21 219 lb 3.2 oz (99.4 kg)  ?05/15/21 221 lb 0.6 oz (100.3 kg)  ?04/04/21 218 lb 1.9 oz (98.9 kg)   ? ? ?Lab Results  ?Component Value Date  ? TSH 14.600 (H) 07/25/2021  ? ?Lab Results  ?Component Value Date  ? WBC 6.9 03/28/2021  ? HGB 14.7 03/28/2021  ? HCT 43.9 03/28/2021  ? MCV 90 03/28/2021  ? PLT 268 03/28/2021  ?

## 2021-09-30 ENCOUNTER — Encounter: Payer: Self-pay | Admitting: Internal Medicine

## 2021-10-07 ENCOUNTER — Telehealth: Payer: Self-pay | Admitting: Internal Medicine

## 2021-10-07 ENCOUNTER — Other Ambulatory Visit: Payer: Self-pay | Admitting: Family Medicine

## 2021-10-07 NOTE — Telephone Encounter (Signed)
Rx sent today.

## 2021-10-07 NOTE — Telephone Encounter (Signed)
Pt called stating she has been trying to get a refill on the HCTZ since Friday. She only has 2 days left. Can you please refill?      [place]

## 2022-01-02 ENCOUNTER — Other Ambulatory Visit: Payer: Self-pay | Admitting: Internal Medicine

## 2022-01-02 ENCOUNTER — Encounter: Payer: Self-pay | Admitting: Internal Medicine

## 2022-01-02 ENCOUNTER — Other Ambulatory Visit: Payer: Self-pay | Admitting: Family Medicine

## 2022-01-25 LAB — CMP14+EGFR
ALT: 36 IU/L — ABNORMAL HIGH (ref 0–32)
AST: 23 IU/L (ref 0–40)
Albumin/Globulin Ratio: 2.9 — ABNORMAL HIGH (ref 1.2–2.2)
Albumin: 4.7 g/dL (ref 3.8–4.9)
Alkaline Phosphatase: 67 IU/L (ref 44–121)
BUN/Creatinine Ratio: 13 (ref 9–23)
BUN: 9 mg/dL (ref 6–24)
Bilirubin Total: 0.4 mg/dL (ref 0.0–1.2)
CO2: 26 mmol/L (ref 20–29)
Calcium: 9.6 mg/dL (ref 8.7–10.2)
Chloride: 101 mmol/L (ref 96–106)
Creatinine, Ser: 0.71 mg/dL (ref 0.57–1.00)
Globulin, Total: 1.6 g/dL (ref 1.5–4.5)
Glucose: 105 mg/dL — ABNORMAL HIGH (ref 70–99)
Potassium: 5.1 mmol/L (ref 3.5–5.2)
Sodium: 141 mmol/L (ref 134–144)
Total Protein: 6.3 g/dL (ref 6.0–8.5)
eGFR: 100 mL/min/{1.73_m2} (ref >59)

## 2022-01-25 LAB — CBC WITH DIFFERENTIAL/PLATELET
Basophils Absolute: 0.1 10*3/uL (ref 0.0–0.2)
Basos: 1 %
EOS (ABSOLUTE): 0.2 10*3/uL (ref 0.0–0.4)
Eos: 3 %
Hematocrit: 41 % (ref 34.0–46.6)
Hemoglobin: 14.3 g/dL (ref 11.1–15.9)
Immature Grans (Abs): 0 10*3/uL (ref 0.0–0.1)
Immature Granulocytes: 0 %
Lymphocytes Absolute: 2.2 10*3/uL (ref 0.7–3.1)
Lymphs: 35 %
MCH: 31.1 pg (ref 26.6–33.0)
MCHC: 34.9 g/dL (ref 31.5–35.7)
MCV: 89 fL (ref 79–97)
Monocytes Absolute: 0.4 10*3/uL (ref 0.1–0.9)
Monocytes: 7 %
Neutrophils Absolute: 3.3 10*3/uL (ref 1.4–7.0)
Neutrophils: 54 %
Platelets: 269 10*3/uL (ref 150–450)
RBC: 4.6 x10E6/uL (ref 3.77–5.28)
RDW: 12.9 % (ref 11.7–15.4)
WBC: 6.2 10*3/uL (ref 3.4–10.8)

## 2022-01-25 LAB — LIPID PANEL
Chol/HDL Ratio: 2.5 ratio (ref 0.0–4.4)
Cholesterol, Total: 147 mg/dL (ref 100–199)
HDL: 58 mg/dL (ref >39)
LDL Chol Calc (NIH): 75 mg/dL (ref 0–99)
Triglycerides: 68 mg/dL (ref 0–149)
VLDL Cholesterol Cal: 14 mg/dL (ref 5–40)

## 2022-01-25 LAB — HEMOGLOBIN A1C
Est. average glucose Bld gHb Est-mCnc: 114 mg/dL
Hgb A1c MFr Bld: 5.6 % (ref 4.8–5.6)

## 2022-01-25 LAB — TSH+FREE T4
Free T4: 1.15 ng/dL (ref 0.82–1.77)
TSH: 3.59 u[IU]/mL (ref 0.450–4.500)

## 2022-01-25 LAB — VITAMIN D 25 HYDROXY (VIT D DEFICIENCY, FRACTURES): Vit D, 25-Hydroxy: 95.5 ng/mL (ref 30.0–100.0)

## 2022-01-30 ENCOUNTER — Encounter: Payer: Self-pay | Admitting: Internal Medicine

## 2022-01-30 ENCOUNTER — Ambulatory Visit (INDEPENDENT_AMBULATORY_CARE_PROVIDER_SITE_OTHER): Payer: BC Managed Care – PPO | Admitting: Internal Medicine

## 2022-01-30 VITALS — BP 132/88 | HR 79 | Ht 67.0 in | Wt 221.0 lb

## 2022-01-30 DIAGNOSIS — E039 Hypothyroidism, unspecified: Secondary | ICD-10-CM

## 2022-01-30 DIAGNOSIS — E782 Mixed hyperlipidemia: Secondary | ICD-10-CM | POA: Diagnosis not present

## 2022-01-30 DIAGNOSIS — Z23 Encounter for immunization: Secondary | ICD-10-CM | POA: Diagnosis not present

## 2022-01-30 DIAGNOSIS — I10 Essential (primary) hypertension: Secondary | ICD-10-CM

## 2022-01-30 DIAGNOSIS — Z0001 Encounter for general adult medical examination with abnormal findings: Secondary | ICD-10-CM | POA: Diagnosis not present

## 2022-01-30 MED ORDER — LEVOTHYROXINE SODIUM 75 MCG PO TABS: 75.0000 ug | ORAL_TABLET | Freq: Every day | ORAL | 1 refills | Status: DC

## 2022-01-30 MED ORDER — HYDROCHLOROTHIAZIDE 25 MG PO TABS: 25.0000 mg | ORAL_TABLET | Freq: Every day | ORAL | 1 refills | Status: DC

## 2022-01-30 MED ORDER — ROSUVASTATIN CALCIUM 10 MG PO TABS: 10.0000 mg | ORAL_TABLET | Freq: Every day | ORAL | 3 refills | Status: DC

## 2022-01-30 NOTE — Assessment & Plan Note (Signed)
BP Readings from Last 1 Encounters:  01/30/22 132/88   Well-controlled with HCTZ Counseled for compliance with the medications Advised DASH diet and moderate exercise/walking, at least 150 mins/week

## 2022-01-30 NOTE — Assessment & Plan Note (Signed)
Physical examination as documented. Fasting blood tests reviewed. Flu vaccine today. Wants to think about Shingrix vaccine for now.

## 2022-01-30 NOTE — Assessment & Plan Note (Signed)
Lab Results  Component Value Date   TSH 3.590 01/24/2022   with normal free T4 On Levothyroxine to 75 mcg QD for now Will recheck TSH and free T4 later

## 2022-01-30 NOTE — Progress Notes (Signed)
Established Patient Office Visit  Subjective:  Patient ID: Nancy [name], female    DOB: September 27, 1965  Age: 56 y.o. MRN: [id number]  CC:  Chief Complaint  Patient presents with   Annual Exam    HPI Nancy [name] is a 56 y.o. female with past medical history of HTN, HLD and hypothyroidism who presents for annual physical.  HTN: BP is well-controlled. Takes medications regularly. Patient denies headache, dizziness, chest pain, dyspnea or palpitations.   Hypothyroidism: She has been doing well with levothyroxine 50 mcg now.  Her TSH and free T4 are normal now. She does not have any palpitations now. Of note, she used to take NP thyroid in the past.  She denies any fatigue, recent change in weight or appetite or tremors currently.  Past Medical History:  Diagnosis Date   Anxiety    Depression    Essential hypertension 05/01/2017   GERD (gastroesophageal reflux disease)    Hyperlipidemia 05/01/2017   Hypertension    Hypothyroidism, adult 03/02/2019   Irregular bleeding 03/10/2013   Thyroid disease    Phreesia 02/26/2020    Past Surgical History:  Procedure Laterality Date   ABDOMINAL HYSTERECTOMY     heavy irreg bleeding   CHOLECYSTECTOMY     COLONOSCOPY N/A 10/09/2016   Procedure: COLONOSCOPY;  Surgeon: Daneil Dolin, MD;  [facility]: [facility];  Service: Endoscopy;  Laterality: N/A;  9:30 AM   DILATION AND CURETTAGE OF UTERUS     ENDOMETRIAL ABLATION     SALPINGOOPHORECTOMY Bilateral 07/06/2013   Procedure: SALPINGO OOPHORECTOMY;  Surgeon: Florian Buff, MD;  Location: [facility];  Service: Gynecology;  Laterality: Bilateral;   SUPRACERVICAL ABDOMINAL HYSTERECTOMY N/A 07/06/2013   Procedure: HYSTERECTOMY SUPRACERVICAL ABDOMINAL;  Surgeon: Florian Buff, MD;  Location: [facility];  Service: Gynecology;  Laterality: N/A;   TUBAL LIGATION      Family History  Problem Relation Age of Onset   COPD Mother    Heart disease Mother        CHF   Hypertension Mother    Cancer  Father        kidney met to lung   Heart attack Father 52       died of heart attack   Hypertension Maternal Grandmother    Heart disease Maternal Grandmother    Rheum arthritis Maternal Grandmother    Hypertension Maternal Grandfather    Heart disease Maternal Grandfather    Hypertension Paternal Grandmother    Heart disease Paternal Grandmother    Hypertension Paternal Grandfather    Heart disease Paternal Grandfather    Cancer Sister 36       lung,renal   Alcohol abuse Sister     Social History   Socioeconomic History   Marital status: Divorced    Spouse name: Not on file   Number of children: 2   Years of education: 16   Highest education level: Bachelor's degree (e.g., BA, AB, BS)  Occupational History   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 Second marriage.[employer], [employer].   2  daughters grown in [place]   7 grand and 1 great       Dog: LabDoodley: Liberty      Enjoys: cruising, camp- being outside      Diet: eats all food groups    Caffeine: coffee- 16 oz; 16 oz   Water: 6-8 cups       Wears seat belt   Does not use phone while driving   Smoke Designer, television/film set in lock box   Social Determinants of Health   Financial Resource Strain: Low Risk  (08/31/2020)   Overall Financial Resource Strain (CARDIA)    Difficulty of Paying Living Expenses: Not very hard  Food Insecurity: No Food Insecurity (08/31/2020)   Hunger Vital Sign    Worried About Running Out of Food in the Last Year: Never true    [place] in the Last Year: Never true  Transportation Needs: No  Transportation Needs (08/31/2020)   PRAPARE - Hydrologist (Medical): No    Lack of Transportation (Non-Medical): No  Physical Activity: Insufficiently Active (08/31/2020)   Exercise Vital Sign    Days of Exercise per Week: 4 days    Minutes of Exercise per Session: 30 min  Stress: No Stress Concern Present (08/31/2020)   [place]    Feeling of Stress : Only a little  Social Connections: Moderately Isolated (08/31/2020)   Social Connection and Isolation Panel [NHANES]    Frequency of Communication with Friends and Family: Three times a week    Frequency of Social Gatherings with Friends and Family: Three times a week    Attends Religious Services: 1 to 4 times per year    Active Member of Clubs or Organizations: No    Attends Archivist Meetings: Not on file    Marital Status: Divorced  Intimate Partner Violence: Not At Risk (02/28/2020)   Humiliation, Afraid, Rape, and Kick questionnaire    Fear of Current or Ex-Partner: No    Emotionally Abused: No    Physically Abused: No    Sexually Abused: No    Outpatient Medications Prior to Visit  Medication Sig Dispense Refill   Cholecalciferol (VITAMIN D-3) 125 MCG (5000 UT) TABS Take 1 tablet by mouth daily.     rizatriptan (MAXALT-MLT) 10 MG disintegrating tablet Take 1 tablet (10 mg total) by mouth as needed for migraine. May repeat in 2 hours if needed. Max of 2 tablets in 24 hour period. 10 tablet 0   hydrochlorothiazide (HYDRODIURIL) 25 MG tablet TAKE 1 TABLET BY MOUTH DAILY. 90 tablet 0   levothyroxine (SYNTHROID) 75 MCG tablet Take 1 tablet (75 mcg total) by mouth daily. 90 tablet 1   rosuvastatin (CRESTOR) 10 MG tablet Take 1 tablet (10 mg total) by mouth daily. 90 tablet 3   No facility-administered medications prior to visit.    No Known Allergies  ROS Review of Systems  Constitutional:  Negative for chills and fever.  HENT:   Negative for congestion, sinus pressure, sinus pain and sore throat.   Eyes:  Negative for pain and discharge.  Respiratory:  Negative for cough and shortness of breath.   Cardiovascular:  Negative for chest pain and palpitations.  Gastrointestinal:  Negative for abdominal pain, constipation, diarrhea, nausea and vomiting.  Endocrine: Negative for polydipsia and polyuria.  Genitourinary:  Negative for dysuria and hematuria.  Musculoskeletal:  Negative for neck pain and neck  stiffness.  Skin:  Negative for rash.  Neurological:  Negative for dizziness and weakness.  Psychiatric/Behavioral:  Negative for agitation and behavioral problems.       Objective:    Physical Exam Vitals reviewed.  Constitutional:      General: She is not in acute distress.    Appearance: She is not diaphoretic.  HENT:     Head: Normocephalic and atraumatic.     Nose: Nose normal. No congestion.     Mouth/Throat:     Mouth: Mucous membranes are moist.     Pharynx: No posterior oropharyngeal erythema.  Eyes:     General: No scleral icterus.    Extraocular Movements: Extraocular movements intact.  Cardiovascular:     Rate and Rhythm: Normal rate and regular rhythm.     Pulses: Normal pulses.     Heart sounds: Normal heart sounds. No murmur heard. Pulmonary:     Breath sounds: Normal breath sounds. No wheezing or rales.  Abdominal:     Palpations: Abdomen is soft.     Tenderness: There is no abdominal tenderness.  Musculoskeletal:     Cervical back: Neck supple. No tenderness.     Right lower leg: No edema.     Left lower leg: No edema.  Skin:    General: Skin is warm.     Findings: No rash.  Neurological:     General: No focal deficit present.     Mental Status: She is alert and oriented to person, place, and time.     Cranial Nerves: No cranial nerve deficit.     Sensory: No sensory deficit.     Motor: No weakness.  Psychiatric:        Mood and Affect: Mood normal.        Behavior: Behavior  normal.     BP 132/88 (BP Location: Left Arm, Cuff Size: Normal)   Pulse 79   Ht 5' 7"  (1.702 m)   Wt 221 lb (100.2 kg)   LMP 07/06/2013 Comment: serum pregnancy negative on 06/30/2013  SpO2 97%   BMI 34.61 kg/m  Wt Readings from Last 3 Encounters:  01/30/22 221 lb (100.2 kg)  07/31/21 219 lb 3.2 oz (99.4 kg)  05/15/21 221 lb 0.6 oz (100.3 kg)    Lab Results  Component Value Date   TSH 3.590 01/24/2022   Lab Results  Component Value Date   WBC 6.2 01/24/2022   HGB 14.3 01/24/2022   HCT 41.0 01/24/2022   MCV 89 01/24/2022   PLT 269 01/24/2022   Lab Results  Component Value Date   NA 141 01/24/2022   K 5.1 01/24/2022   CO2 26 01/24/2022   GLUCOSE 105 (H) 01/24/2022   BUN 9 01/24/2022   CREATININE 0.71 01/24/2022   BILITOT 0.4 01/24/2022   ALKPHOS 67 01/24/2022   AST 23 01/24/2022   ALT 36 (H) 01/24/2022   PROT 6.3 01/24/2022   ALBUMIN 4.7 01/24/2022   CALCIUM 9.6 01/24/2022   ANIONGAP 8 02/01/2017   EGFR 100 01/24/2022   Lab Results  Component Value Date   CHOL 147 01/24/2022   Lab Results  Component Value Date   HDL 58 01/24/2022   Lab Results  Component Value Date   LDLCALC 75 01/24/2022   Lab Results  Component Value Date   TRIG 68 01/24/2022   Lab Results  Component Value Date   CHOLHDL 2.5 01/24/2022   Lab Results  Component Value Date   HGBA1C 5.6 01/24/2022  Assessment & Plan:   Problem List Items Addressed This Visit       Cardiovascular and Mediastinum   Essential hypertension    BP Readings from Last 1 Encounters:  01/30/22 132/88  Well-controlled with HCTZ Counseled for compliance with the medications Advised DASH diet and moderate exercise/walking, at least 150 mins/week      Relevant Medications   hydrochlorothiazide (HYDRODIURIL) 25 MG tablet   rosuvastatin (CRESTOR) 10 MG tablet   Other Relevant Orders   CMP14+EGFR     Endocrine   Hypothyroidism, adult    Lab Results  Component Value Date   TSH 3.590  01/24/2022  with normal free T4 On Levothyroxine to 75 mcg QD for now Will recheck TSH and free T4 later      Relevant Medications   levothyroxine (SYNTHROID) 75 MCG tablet   Other Relevant Orders   TSH + free T4   CMP14+EGFR     Other   Hyperlipidemia    On Crestor Lipid profile reviewed      Relevant Medications   hydrochlorothiazide (HYDRODIURIL) 25 MG tablet   rosuvastatin (CRESTOR) 10 MG tablet   Encounter for general adult medical examination with abnormal findings - Primary    Physical examination as documented. Fasting blood tests reviewed. Flu vaccine today. Wants to think about Shingrix vaccine for now.       Meds ordered this encounter  Medications   hydrochlorothiazide (HYDRODIURIL) 25 MG tablet    Sig: Take 1 tablet (25 mg total) by mouth daily.    Dispense:  90 tablet    Refill:  1   levothyroxine (SYNTHROID) 75 MCG tablet    Sig: Take 1 tablet (75 mcg total) by mouth daily.    Dispense:  90 tablet    Refill:  1   rosuvastatin (CRESTOR) 10 MG tablet    Sig: Take 1 tablet (10 mg total) by mouth daily.    Dispense:  90 tablet    Refill:  3    Follow-up: Return in about 6 months (around 08/01/2022) for HTN and hypothyroidism.    Lindell Spar, MD

## 2022-01-30 NOTE — Assessment & Plan Note (Signed)
On Crestor Lipid profile reviewed 

## 2022-01-30 NOTE — Patient Instructions (Signed)
Please continue taking medications as prescribed.  Please continue to follow low carb diet and perform moderate exercise/walking at least 150 mins/week. 

## 2022-03-26 ENCOUNTER — Other Ambulatory Visit (HOSPITAL_COMMUNITY): Payer: Self-pay | Admitting: Internal Medicine

## 2022-03-26 DIAGNOSIS — Z1231 Encounter for screening mammogram for malignant neoplasm of breast: Secondary | ICD-10-CM

## 2022-04-23 IMAGING — MG MM DIGITAL DIAGNOSTIC UNILAT*L* W/ TOMO W/ CAD
6 of 12 series · 6 of 36 positions shown · non-contrast
Comparison: Previous exam(s).

CLINICAL DATA: Patient recalled from screening breast asymmetry.

EXAM:
DIGITAL DIAGNOSTIC UNILATERAL LEFT MAMMOGRAM WITH TOMOSYNTHESIS AND
CAD
TECHNIQUE: Left digital diagnostic mammography and breast tomosynthesis was
performed. The images were evaluated with computer-aided detection.

[L MLO synth-2D (1 of 2)]
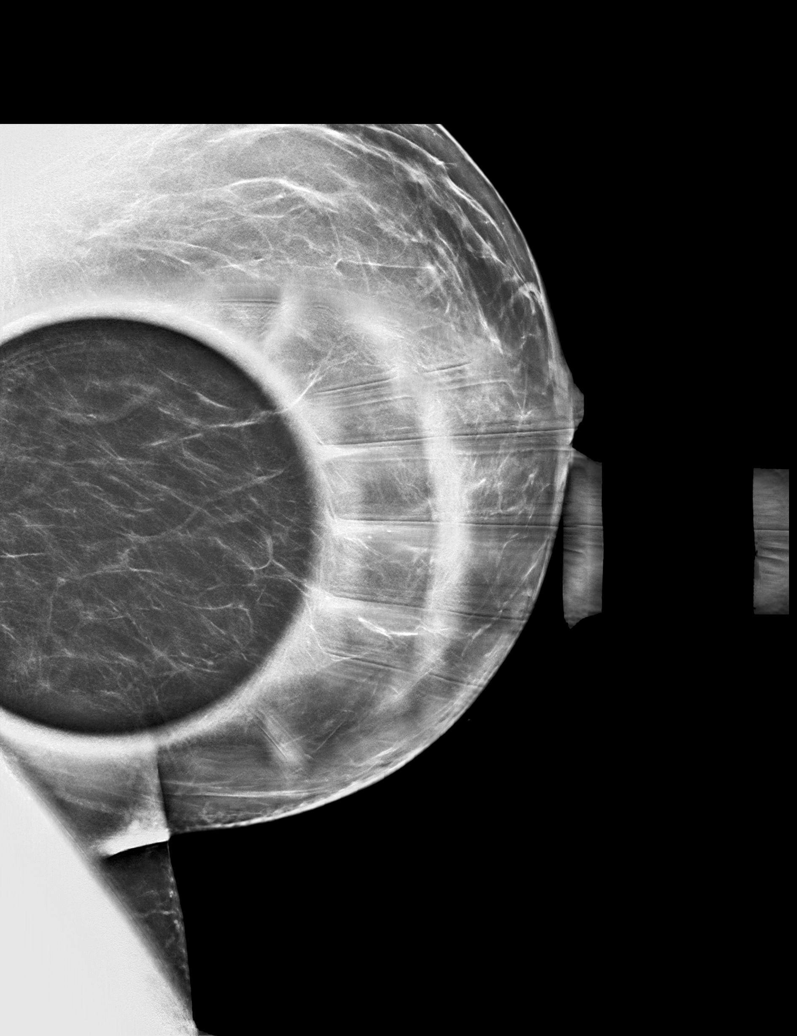

[L XCCL synth-2D]
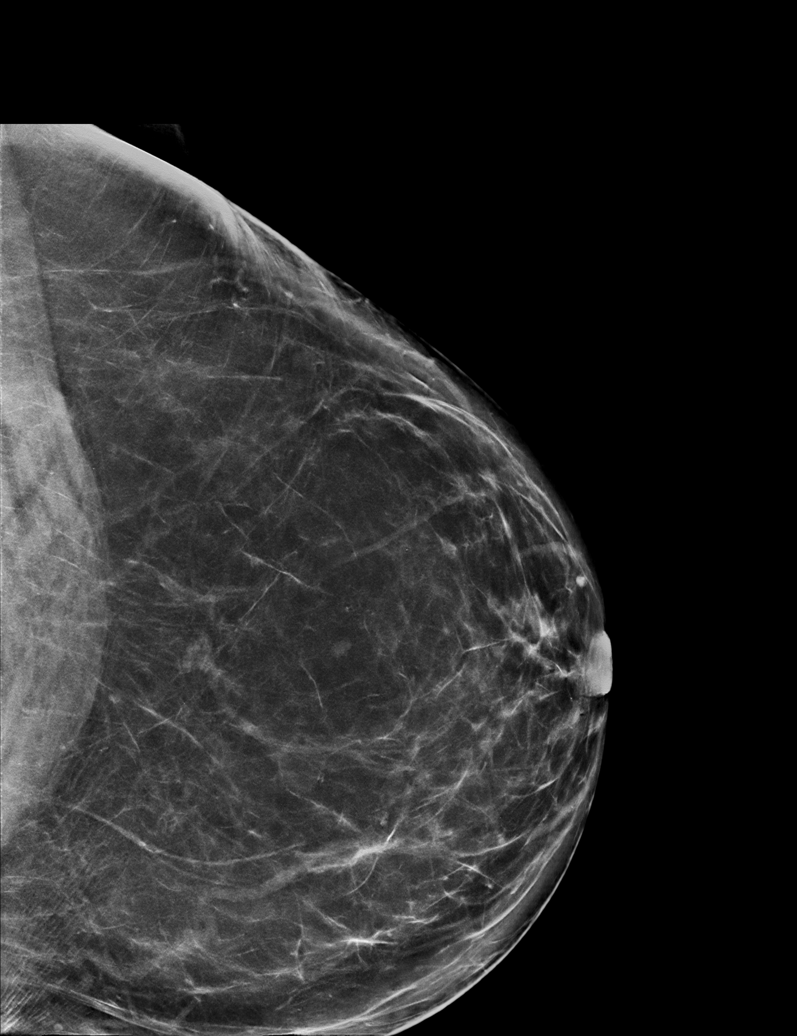

[L CC synth-2D (1 of 2)]
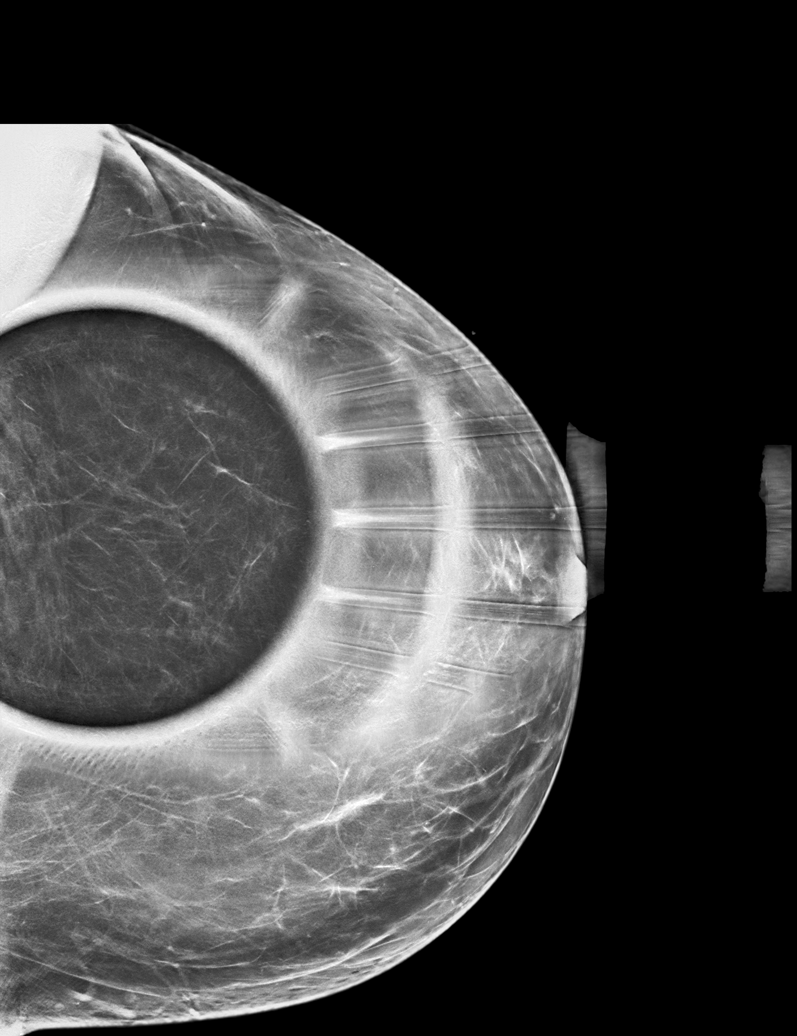

[L ML synth-2D]
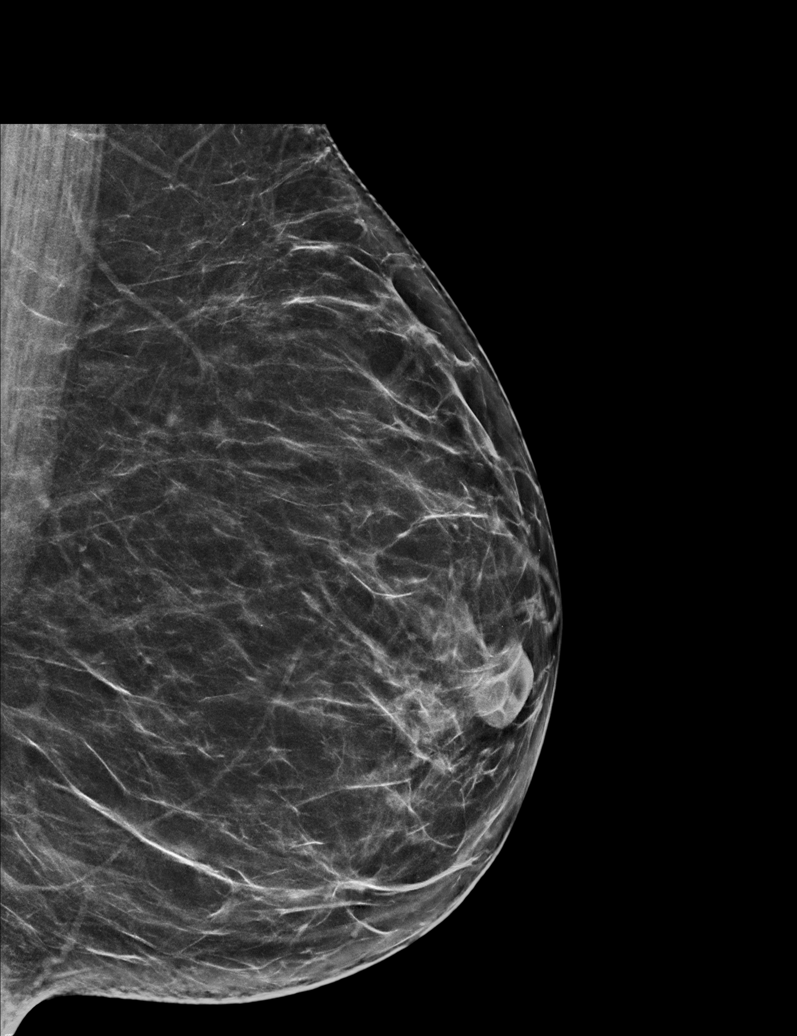

[L CC synth-2D (2 of 2)]
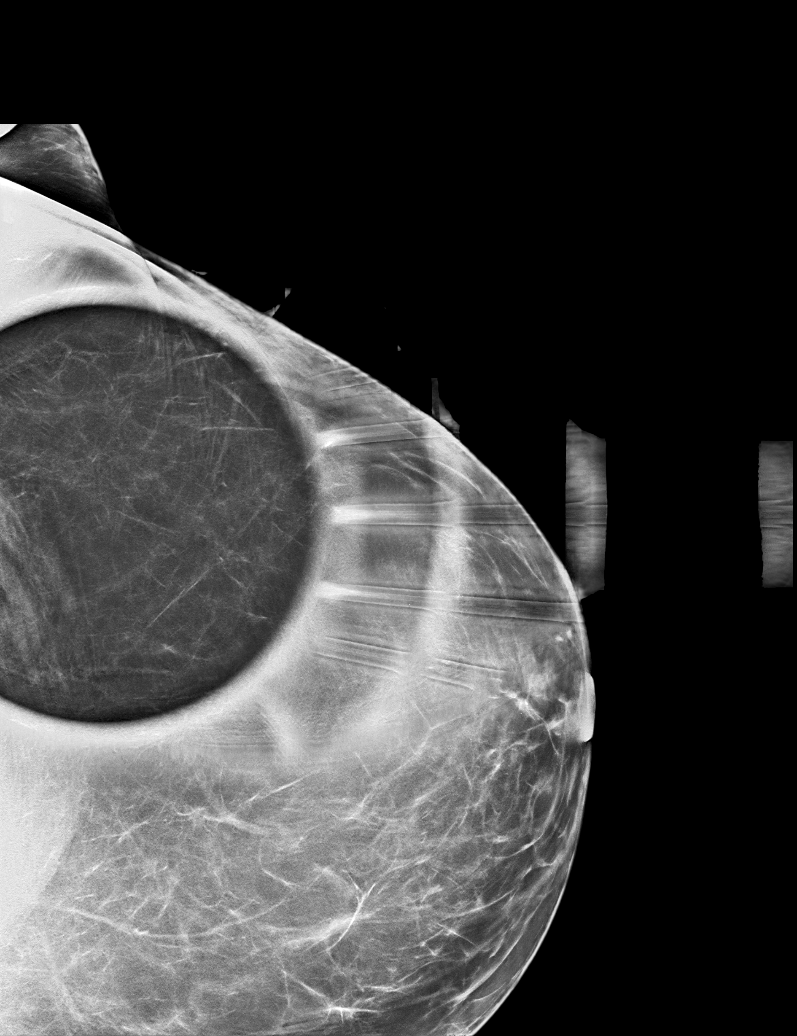

[L MLO synth-2D (2 of 2)]
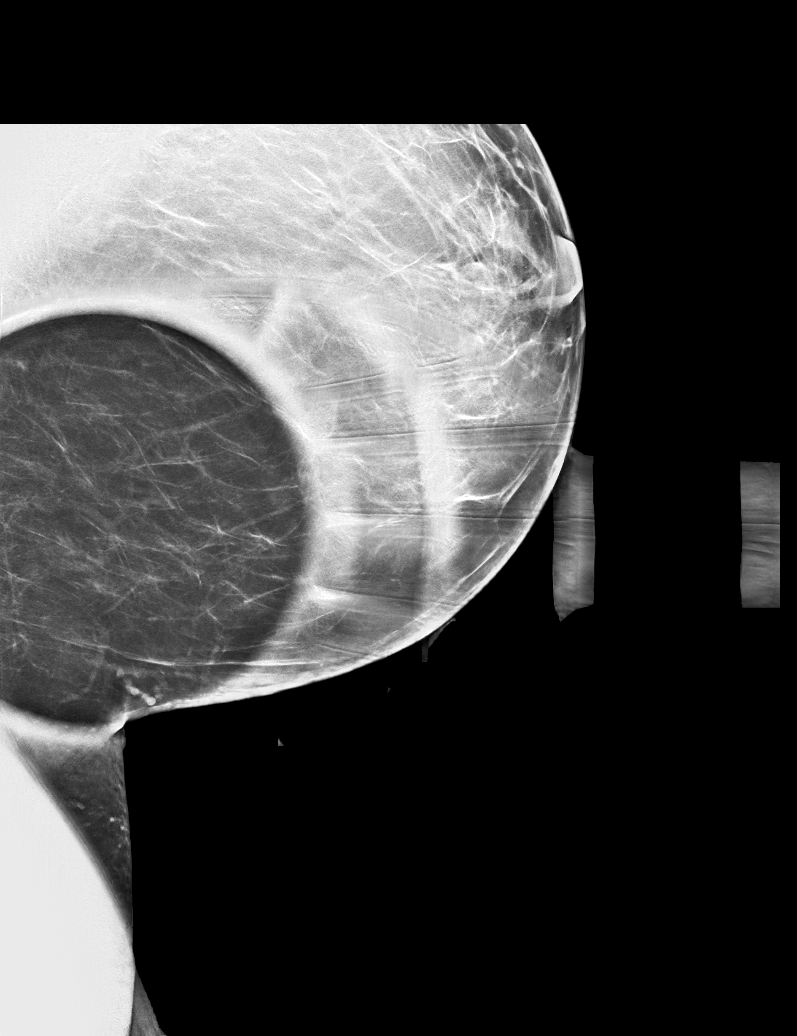

[6 of 36 positions shown; findings below may reference images not displayed]

ACR Breast Density Category c: The breast tissue is heterogeneously
dense, which may obscure small masses.
FINDINGS: Questioned asymmetry within the outer left breast resolves with
additional imaging compatible with dense overlapping fibroglandular
tissue. No suspicious findings on additional imaging.
IMPRESSION: No mammographic evidence of malignancy.

RECOMMENDATION:
Screening mammogram in one year.(Code:23-1-ZL3)

I have discussed the findings and recommendations with the patient.
If applicable, a reminder letter will be sent to the patient
regarding the next appointment.

BI-RADS CATEGORY  1: Negative.

## 2022-04-30 ENCOUNTER — Inpatient Hospital Stay (HOSPITAL_COMMUNITY): Admission: RE | Admit: 2022-04-30 | Payer: BC Managed Care – PPO | Source: Ambulatory Visit

## 2022-05-05 ENCOUNTER — Ambulatory Visit (HOSPITAL_COMMUNITY)
Admission: RE | Admit: 2022-05-05 | Discharge: 2022-05-05 | Disposition: A | Discharge status: Home / Self Care | Payer: BC Managed Care – PPO | Source: Ambulatory Visit | Attending: Internal Medicine | Admitting: Internal Medicine

## 2022-05-05 DIAGNOSIS — Z1231 Encounter for screening mammogram for malignant neoplasm of breast: Secondary | ICD-10-CM

## 2022-07-15 ENCOUNTER — Encounter: Payer: Self-pay | Admitting: Internal Medicine

## 2022-07-26 LAB — TSH+FREE T4
Free T4: 1.2 ng/dL (ref 0.82–1.77)
TSH: 7.42 u[IU]/mL — ABNORMAL HIGH (ref 0.450–4.500)

## 2022-07-26 LAB — CMP14+EGFR
ALT: 30 IU/L (ref 0–32)
AST: 23 IU/L (ref 0–40)
Albumin/Globulin Ratio: 3.1 — ABNORMAL HIGH (ref 1.2–2.2)
Albumin: 4.6 g/dL (ref 3.8–4.9)
Alkaline Phosphatase: 69 IU/L (ref 44–121)
BUN/Creatinine Ratio: 15 (ref 9–23)
BUN: 12 mg/dL (ref 6–24)
Bilirubin Total: 0.6 mg/dL (ref 0.0–1.2)
CO2: 24 mmol/L (ref 20–29)
Calcium: 9.5 mg/dL (ref 8.7–10.2)
Chloride: 102 mmol/L (ref 96–106)
Creatinine, Ser: 0.82 mg/dL (ref 0.57–1.00)
Globulin, Total: 1.5 g/dL (ref 1.5–4.5)
Glucose: 92 mg/dL (ref 70–99)
Potassium: 4.7 mmol/L (ref 3.5–5.2)
Sodium: 140 mmol/L (ref 134–144)
Total Protein: 6.1 g/dL (ref 6.0–8.5)
eGFR: 84 mL/min/{1.73_m2} (ref >59)

## 2022-07-30 ENCOUNTER — Ambulatory Visit: Payer: BC Managed Care – PPO | Admitting: Internal Medicine

## 2022-07-30 ENCOUNTER — Encounter: Payer: Self-pay | Admitting: Internal Medicine

## 2022-07-30 VITALS — BP 138/82 | HR 78 | Ht 67.0 in | Wt 219.0 lb

## 2022-07-30 DIAGNOSIS — I10 Essential (primary) hypertension: Secondary | ICD-10-CM

## 2022-07-30 DIAGNOSIS — G43109 Migraine with aura, not intractable, without status migrainosus: Secondary | ICD-10-CM

## 2022-07-30 DIAGNOSIS — E039 Hypothyroidism, unspecified: Secondary | ICD-10-CM

## 2022-07-30 DIAGNOSIS — E6609 Other obesity due to excess calories: Secondary | ICD-10-CM | POA: Diagnosis not present

## 2022-07-30 DIAGNOSIS — E782 Mixed hyperlipidemia: Secondary | ICD-10-CM

## 2022-07-30 MED ORDER — LEVOTHYROXINE SODIUM 88 MCG PO TABS: 88.0000 ug | ORAL_TABLET | Freq: Every day | ORAL | 1 refills | Status: DC

## 2022-07-30 NOTE — Patient Instructions (Signed)
Please start taking Levothyroxine 88 mcg once daily, on empty stomach. Please avoid taking other medicines for at least 1 hour.  Please take Rosuvastatin and vitamin supplement in the evening.  Please continue to follow low salt diet and perform moderate exercise/walking at least 150 mins/week.  Please get blood tests done before the next visit.

## 2022-07-30 NOTE — Progress Notes (Unsigned)
Established Patient Office Visit  Subjective:  Patient ID: Nancy [name], female    DOB: Jul 15, 1965  Age: 57 y.o. MRN: [id number]  CC:  Chief Complaint  Patient presents with   Hypertension    Six month follow up for hypertension and hypothyroidism     HPI Nancy [name] is a 57 y.o. female with past medical history of HTN, HLD and hypothyroidism who presents for f/u of her chronic medical conditions.  HTN: BP is well-controlled. Takes medications regularly. Patient denies headache, dizziness, chest pain, dyspnea or palpitations.  Hypothyroidism: She has been doing well with levothyroxine 50 mcg now.  Her TSH is still elevated with normal free T4. She does not have any palpitations now. Of note, she used to take NP thyroid in the past.  She denies any fatigue, recent change in weight or appetite or tremors currently. She agrees to increase dose of Levothyroxine now as her TSH is still elevated.   Past Medical History:  Diagnosis Date   Anxiety    Depression    Essential hypertension 05/01/2017   GERD (gastroesophageal reflux disease)    Hyperlipidemia 05/01/2017   Hypertension    Hypothyroidism, adult 03/02/2019   Irregular bleeding 03/10/2013   Thyroid disease    Phreesia 02/26/2020    Past Surgical History:  Procedure Laterality Date   ABDOMINAL HYSTERECTOMY     heavy irreg bleeding   CHOLECYSTECTOMY     COLONOSCOPY N/A 10/09/2016   Procedure: COLONOSCOPY;  Surgeon: Daneil Dolin, MD;  [facility];  Service: Endoscopy;  Laterality: N/A;  9:30 AM   DILATION AND CURETTAGE OF UTERUS     ENDOMETRIAL ABLATION     SALPINGOOPHORECTOMY Bilateral 07/06/2013   Procedure: SALPINGO OOPHORECTOMY;  Surgeon: Florian Buff, MD;  Location: [facility];  Service: Gynecology;  Laterality: Bilateral;   SUPRACERVICAL ABDOMINAL HYSTERECTOMY N/A 07/06/2013   Procedure: HYSTERECTOMY SUPRACERVICAL ABDOMINAL;  Surgeon: Florian Buff, MD;  Location: [facility];  Service: Gynecology;   Laterality: N/A;   TUBAL LIGATION      Family History  Problem Relation Age of Onset   COPD Mother    Heart disease Mother        CHF   Hypertension Mother    Cancer Father        kidney met to lung   Heart attack Father 5       died of heart attack   Hypertension Maternal Grandmother    Heart disease Maternal Grandmother    Rheum arthritis Maternal Grandmother    Hypertension Maternal Grandfather    Heart disease Maternal Grandfather    Hypertension Paternal Grandmother    Heart disease Paternal Grandmother    Hypertension Paternal Grandfather    Heart disease Paternal Grandfather    Cancer Sister 67       lung,renal   Alcohol abuse Sister     Social History   Socioeconomic History   Marital status: Divorced    Spouse name: Not on file   Number of children: 2   Years of education: 16   Highest education level: Bachelor's degree (e.g., BA, AB, BS)  Occupational History   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 Second marriage.[employer], [employer].   2 daughters grown in [place]   7 grand and 1 great       Dog: LabDoodley: [place]      Enjoys: cruising, camp- being outside      Diet: eats all food groups    Caffeine: coffee- 16 oz; 16 oz   Water: 6-8 cups       Wears seat belt   Does not use phone while driving   Smoke Designer, television/film set in lock box   Social Determinants of Health   Financial Resource Strain: Medium Risk (07/29/2022)   Overall Financial Resource Strain (CARDIA)    Difficulty of Paying Living Expenses: Somewhat hard  Food Insecurity: No Food Insecurity (07/29/2022)    Hunger Vital Sign    Worried About Running Out of Food in the Last Year: Never true    [place] in the Last Year: Never true  Transportation Needs: No Transportation Needs (07/29/2022)   PRAPARE - Hydrologist (Medical): No    Lack of Transportation (Non-Medical): No  Physical Activity: Insufficiently Active (07/29/2022)   Exercise Vital Sign    Days of Exercise per Week: 1 day    Minutes of Exercise per Session: 20 min  Stress: No Stress Concern Present (08/31/2020)   [place]    Feeling of Stress : Only a little  Social Connections: Moderately Isolated (07/29/2022)   Social Connection and Isolation Panel [NHANES]    Frequency of Communication with Friends and Family: More than three times a week    Frequency of Social Gatherings with Friends and Family: Once a week    Attends Religious Services: 1 to 4 times per year    Active Member of [employer] or Organizations: No    Attends Music therapist: Not on file    Marital Status: Divorced  Intimate Partner Violence: Not At Risk (02/28/2020)   Humiliation, Afraid, Rape, and Kick questionnaire    Fear of Current or Ex-Partner: No    Emotionally Abused: No    Physically Abused: No    Sexually Abused: No    Outpatient Medications Prior to Visit  Medication Sig Dispense Refill   Cholecalciferol (VITAMIN D-3) 125 MCG (5000 UT) TABS Take 1 tablet by mouth daily.     hydrochlorothiazide (HYDRODIURIL) 25 MG tablet Take 1 tablet (25 mg total) by mouth daily. 90 tablet 1   rizatriptan (MAXALT-MLT) 10 MG disintegrating tablet Take 1 tablet (10 mg total) by mouth as needed for migraine. May repeat in 2 hours if needed. Max of 2 tablets in 24 hour period. 10 tablet 0   rosuvastatin (CRESTOR) 10 MG tablet Take 1 tablet (10 mg total) by mouth daily. 90 tablet 3   levothyroxine (SYNTHROID) 75 MCG tablet Take 1 tablet (75 mcg total) by mouth  daily. 90 tablet 1   No facility-administered medications prior to visit.    No Known Allergies  ROS Review of Systems  Constitutional:  Negative for chills and fever.  HENT:  Negative for congestion, sinus pressure, sinus pain and sore throat.   Eyes:  Negative for pain and discharge.  Respiratory:  Negative for cough and shortness of breath.   Cardiovascular:  Negative for chest pain and palpitations.  Gastrointestinal:  Negative  for abdominal pain, constipation, diarrhea, nausea and vomiting.  Endocrine: Negative for polydipsia and polyuria.  Genitourinary:  Negative for dysuria and hematuria.  Musculoskeletal:  Negative for neck pain and neck stiffness.  Skin:  Negative for rash.  Neurological:  Negative for dizziness and weakness.  Psychiatric/Behavioral:  Negative for agitation and behavioral problems.       Objective:    Physical Exam Vitals reviewed.  Constitutional:      General: She is not in acute distress.    Appearance: She is not diaphoretic.  HENT:     Head: Normocephalic and atraumatic.     Nose: Nose normal. No congestion.     Mouth/Throat:     Mouth: Mucous membranes are moist.     Pharynx: No posterior oropharyngeal erythema.  Eyes:     General: No scleral icterus.    Extraocular Movements: Extraocular movements intact.  Cardiovascular:     Rate and Rhythm: Normal rate and regular rhythm.     Pulses: Normal pulses.     Heart sounds: Normal heart sounds. No murmur heard. Pulmonary:     Breath sounds: Normal breath sounds. No wheezing or rales.  Musculoskeletal:     Cervical back: Neck supple. No tenderness.     Right lower leg: No edema.     Left lower leg: No edema.  Skin:    General: Skin is warm.     Findings: No rash.  Neurological:     General: No focal deficit present.     Mental Status: She is alert and oriented to person, place, and time.     Sensory: No sensory deficit.     Motor: No weakness.  Psychiatric:        Mood and Affect:  Mood normal.        Behavior: Behavior normal.     BP 138/82 (BP Location: Left Arm)   Pulse 78   Ht 5\' 7"  (1.702 m)   Wt 219 lb (99.3 kg)   LMP 07/06/2013 Comment: serum pregnancy negative on 06/30/2013  SpO2 96%   BMI 34.30 kg/m  Wt Readings from Last 3 Encounters:  07/30/22 219 lb (99.3 kg)  01/30/22 221 lb (100.2 kg)  07/31/21 219 lb 3.2 oz (99.4 kg)    Lab Results  Component Value Date   TSH 7.420 (H) 07/25/2022   Lab Results  Component Value Date   WBC 6.2 01/24/2022   HGB 14.3 01/24/2022   HCT 41.0 01/24/2022   MCV 89 01/24/2022   PLT 269 01/24/2022   Lab Results  Component Value Date   NA 140 07/25/2022   K 4.7 07/25/2022   CO2 24 07/25/2022   GLUCOSE 92 07/25/2022   BUN 12 07/25/2022   CREATININE 0.82 07/25/2022   BILITOT 0.6 07/25/2022   ALKPHOS 69 07/25/2022   AST 23 07/25/2022   ALT 30 07/25/2022   PROT 6.1 07/25/2022   ALBUMIN 4.6 07/25/2022   CALCIUM 9.5 07/25/2022   ANIONGAP 8 02/01/2017   EGFR 84 07/25/2022   Lab Results  Component Value Date   CHOL 147 01/24/2022   Lab Results  Component Value Date   HDL 58 01/24/2022   Lab Results  Component Value Date   LDLCALC 75 01/24/2022   Lab Results  Component Value Date   TRIG 68 01/24/2022   Lab Results  Component Value Date   CHOLHDL 2.5 01/24/2022   Lab Results  Component Value Date   HGBA1C 5.6 01/24/2022      Assessment &  Plan:   Problem List Items Addressed This Visit    Essential hypertension BP Readings from Last 1 Encounters:  07/30/22 138/82   Well-controlled with HCTZ Counseled for compliance with the medications Advised DASH diet and moderate exercise/walking, at least 150 mins/week  Migraine with aura Well-controlled now Rarely needs Maxalt  Hypothyroidism, adult Lab Results  Component Value Date   TSH 7.420 (H) 07/25/2022   with normal free T4 On Levothyroxine to 75 mcg QD, increased dose to 88 mcg, needs to take it on empty stomach Will recheck  TSH and free T4 later  Obesity Advised to follow low-carb diet and perform moderate exercise as tolerated  Hyperlipidemia On Crestor Lipid profile reviewed    Meds ordered this encounter  Medications   levothyroxine (SYNTHROID) 88 MCG tablet    Sig: Take 1 tablet (88 mcg total) by mouth daily.    Dispense:  90 tablet    Refill:  1    Follow-up: Return in about 3 months (around 10/29/2022) for HTN and hypothyroidism.    Lindell Spar, MD

## 2022-07-31 ENCOUNTER — Encounter: Payer: Self-pay | Admitting: Internal Medicine

## 2022-07-31 NOTE — Assessment & Plan Note (Signed)
Lab Results  Component Value Date   TSH 7.420 (H) 07/25/2022   with normal free T4 On Levothyroxine to 75 mcg QD, increased dose to 88 mcg, needs to take it on empty stomach Will recheck TSH and free T4 later

## 2022-07-31 NOTE — Assessment & Plan Note (Signed)
On Crestor Lipid profile reviewed 

## 2022-07-31 NOTE — Assessment & Plan Note (Signed)
BP Readings from Last 1 Encounters:  07/30/22 138/82   Well-controlled with HCTZ Counseled for compliance with the medications Advised DASH diet and moderate exercise/walking, at least 150 mins/week

## 2022-07-31 NOTE — Assessment & Plan Note (Signed)
Well-controlled now Rarely needs Maxalt

## 2022-07-31 NOTE — Assessment & Plan Note (Signed)
Advised to follow low-carb diet and perform moderate exercise as tolerated 

## 2022-08-05 ENCOUNTER — Other Ambulatory Visit: Payer: Self-pay | Admitting: Internal Medicine

## 2022-08-05 DIAGNOSIS — I10 Essential (primary) hypertension: Secondary | ICD-10-CM

## 2022-08-05 MED ORDER — LOSARTAN POTASSIUM 25 MG PO TABS: 25.0000 mg | ORAL_TABLET | Freq: Every day | ORAL | 0 refills | Status: DC

## 2022-09-28 ENCOUNTER — Other Ambulatory Visit: Payer: Self-pay | Admitting: Internal Medicine

## 2022-09-28 DIAGNOSIS — I10 Essential (primary) hypertension: Secondary | ICD-10-CM

## 2022-10-26 ENCOUNTER — Other Ambulatory Visit: Payer: Self-pay | Admitting: Internal Medicine

## 2022-10-26 DIAGNOSIS — I10 Essential (primary) hypertension: Secondary | ICD-10-CM

## 2022-11-08 LAB — BASIC METABOLIC PANEL
BUN/Creatinine Ratio: 16 (ref 9–23)
BUN: 13 mg/dL (ref 6–24)
CO2: 25 mmol/L (ref 20–29)
Calcium: 9.6 mg/dL (ref 8.7–10.2)
Chloride: 102 mmol/L (ref 96–106)
Creatinine, Ser: 0.8 mg/dL (ref 0.57–1.00)
Glucose: 98 mg/dL (ref 70–99)
Potassium: 4.3 mmol/L (ref 3.5–5.2)
Sodium: 141 mmol/L (ref 134–144)
eGFR: 86 mL/min/{1.73_m2} (ref >59)

## 2022-11-08 LAB — TSH+FREE T4
Free T4: 1.27 ng/dL (ref 0.82–1.77)
TSH: 2.82 u[IU]/mL (ref 0.450–4.500)

## 2022-11-11 ENCOUNTER — Ambulatory Visit: Payer: BC Managed Care – PPO | Admitting: Internal Medicine

## 2022-11-11 ENCOUNTER — Encounter: Payer: Self-pay | Admitting: Internal Medicine

## 2022-11-11 VITALS — BP 144/86 | HR 77 | Ht 67.0 in | Wt 220.6 lb

## 2022-11-11 DIAGNOSIS — E6609 Other obesity due to excess calories: Secondary | ICD-10-CM

## 2022-11-11 DIAGNOSIS — E782 Mixed hyperlipidemia: Secondary | ICD-10-CM | POA: Diagnosis not present

## 2022-11-11 DIAGNOSIS — E039 Hypothyroidism, unspecified: Secondary | ICD-10-CM | POA: Diagnosis not present

## 2022-11-11 DIAGNOSIS — I10 Essential (primary) hypertension: Secondary | ICD-10-CM

## 2022-11-11 MED ORDER — LOSARTAN POTASSIUM 50 MG PO TABS: 50.0000 mg | ORAL_TABLET | Freq: Every day | ORAL | 0 refills | Status: DC

## 2022-11-11 NOTE — Progress Notes (Signed)
Established Patient Office Visit  Subjective:  Patient ID: Nancy [name], female    DOB: [birth year]  Age: 57 y.o. MRN: [id number]  CC:  Chief Complaint  Patient presents with   Hypothyroidism    Three month follow up    Hypertension    Three month follow up    HPI Nancy [name] is a 57 y.o. female with past medical history of HTN, HLD and hypothyroidism who presents for f/u of her chronic medical conditions.  HTN: BP is still elevated. Takes medications regularly. Patient denies headache, dizziness, chest pain, dyspnea or palpitations.  Hypothyroidism: She has been doing well with levothyroxine 88 mcg now.  Her TSH is wnl now with normal free T4. She does not have any palpitations now. Of note, she used to take NP thyroid in the past.  She denies any fatigue, recent change in weight or appetite or tremors currently.  HLD: She takes Crestor for HLD.  Past Medical History:  Diagnosis Date   Anxiety    Depression    Essential hypertension 05/01/2017   GERD (gastroesophageal reflux disease)    Hyperlipidemia 05/01/2017   Hypertension    Hypothyroidism, adult 03/02/2019   Irregular bleeding 03/10/2013   Thyroid disease    Phreesia 02/26/2020    Past Surgical History:  Procedure Laterality Date   ABDOMINAL HYSTERECTOMY     heavy irreg bleeding   CHOLECYSTECTOMY     COLONOSCOPY N/A 10/09/2016   Procedure: COLONOSCOPY;  Surgeon: Corbin Ade, MD;  Location: [facility];  Service: Endoscopy;  Laterality: N/A;  9:30 AM   DILATION AND CURETTAGE OF UTERUS     ENDOMETRIAL ABLATION     SALPINGOOPHORECTOMY Bilateral 07/06/2013   Procedure: SALPINGO OOPHORECTOMY;  Surgeon: Lazaro Arms, MD;  Location: [facility];  Service: Gynecology;  Laterality: Bilateral;   SUPRACERVICAL ABDOMINAL HYSTERECTOMY N/A 07/06/2013   Procedure: HYSTERECTOMY SUPRACERVICAL ABDOMINAL;  Surgeon: Lazaro Arms, MD;  Location: [facility];  Service: Gynecology;  Laterality: N/A;   TUBAL LIGATION       Family History  Problem Relation Age of Onset   COPD Mother    Heart disease Mother        CHF   Hypertension Mother    Cancer Father        kidney met to lung   Heart attack Father 5       died of heart attack   Hypertension Maternal Grandmother    Heart disease Maternal Grandmother    Rheum arthritis Maternal Grandmother    Hypertension Maternal Grandfather    Heart disease Maternal Grandfather    Hypertension Paternal Grandmother    Heart disease Paternal Grandmother    Hypertension Paternal Grandfather    Heart disease Paternal Grandfather    Cancer Sister 25       lung,renal   Alcohol abuse Sister     Social History   Socioeconomic History   Marital status: Divorced    Spouse name: Not on file   Number of children: 2   Years of education: 16   Highest education level: Bachelor's degree (e.g., BA, AB, BS)  Occupational History   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 Second marriage.[employer], site.   2 daughters grown in [place]   7 grand and 1 great       Dog: LabDoodley: [place]      Enjoys: cruising, camp- being outside      Diet: eats all food groups    Caffeine: coffee- 16 oz; 16 oz   Water: 6-8 cups       Wears seat belt   Does not use phone while driving   Smoke Barrister's clerk in lock box   Social Determinants of Health   Financial Resource Strain: Medium Risk (07/29/2022)   Overall Financial Resource Strain (CARDIA)    Difficulty of Paying Living Expenses: Somewhat hard  Food Insecurity: No Food Insecurity (07/29/2022)   Hunger Vital Sign    Worried  About Running Out of Food in the Last Year: Never true    Ran Out of Food in the Last Year: Never true  Transportation Needs: No Transportation Needs (07/29/2022)   PRAPARE - Administrator, Civil Service (Medical): No    Lack of Transportation (Non-Medical): No  Physical Activity: Insufficiently Active (07/29/2022)   Exercise Vital Sign    Days of Exercise per Week: 1 day    Minutes of Exercise per Session: 20 min  Stress: No Stress Concern Present (08/31/2020)   [employer] of Occupational Health - Occupational Stress Questionnaire    Feeling of Stress : Only a little  Social Connections: Moderately Isolated (07/29/2022)   Social Connection and Isolation Panel [NHANES]    Frequency of Communication with Friends and Family: More than three times a week    Frequency of Social Gatherings with Friends and Family: Once a week    Attends Religious Services: 1 to 4 times per year    Active Member of [employer] or Organizations: No    Attends Engineer, structural: Not on file    Marital Status: Divorced  Intimate Partner Violence: Not At Risk (02/28/2020)   Humiliation, Afraid, Rape, and Kick questionnaire    Fear of Current or Ex-Partner: No    Emotionally Abused: No    Physically Abused: No    Sexually Abused: No    Outpatient Medications Prior to Visit  Medication Sig Dispense Refill   Cholecalciferol (VITAMIN D-3) 125 MCG (5000 UT) TABS Take 1 tablet by mouth daily.     hydrochlorothiazide (HYDRODIURIL) 25 MG tablet TAKE 1 TABLET BY MOUTH DAILY. 90 tablet 0   levothyroxine (SYNTHROID) 88 MCG tablet Take 1 tablet (88 mcg total) by mouth daily. 90 tablet 1   rizatriptan (MAXALT-MLT) 10 MG disintegrating tablet Take 1 tablet (10 mg total) by mouth as needed for migraine. May repeat in 2 hours if needed. Max of 2 tablets in 24 hour period. 10 tablet 0   rosuvastatin (CRESTOR) 10 MG tablet Take 1 tablet (10 mg total) by mouth daily. 90 tablet 3   losartan (COZAAR) 25 MG  tablet TAKE ONE TABLET BY MOUTH ONCE DAILY. 90 tablet 0   No facility-administered medications prior to visit.    No Known Allergies  ROS Review of Systems  Constitutional:  Negative for chills and fever.  HENT:  Negative for congestion, sinus pressure, sinus pain and sore throat.   Eyes:  Negative for pain and discharge.  Respiratory:  Negative for cough and shortness of breath.   Cardiovascular:  Negative for chest pain and palpitations.  Gastrointestinal:  Negative for abdominal pain, constipation, diarrhea, nausea and vomiting.  Endocrine: Negative for polydipsia and polyuria.  Genitourinary:  Negative for dysuria and hematuria.  Musculoskeletal:  Negative for neck pain and neck stiffness.  Skin:  Negative for rash.  Neurological:  Negative for dizziness and weakness.  Psychiatric/Behavioral:  Negative for agitation and behavioral problems.       Objective:    Physical Exam Vitals reviewed.  Constitutional:      General: She is not in acute distress.    Appearance: She is not diaphoretic.  HENT:     Head: Normocephalic and atraumatic.     Nose: Nose normal. No congestion.     Mouth/Throat:     Mouth: Mucous membranes are moist.     Pharynx: No posterior oropharyngeal erythema.  Eyes:     General: No scleral icterus.    Extraocular Movements: Extraocular movements intact.  Cardiovascular:     Rate and Rhythm: Normal rate and regular rhythm.     Pulses: Normal pulses.     Heart sounds: Normal heart sounds. No murmur heard. Pulmonary:     Breath sounds: Normal breath sounds. No wheezing or rales.  Musculoskeletal:     Cervical back: Neck supple. No tenderness.     Right lower leg: No edema.     Left lower leg: No edema.  Skin:    General: Skin is warm.     Findings: No rash.  Neurological:     General: No focal deficit present.     Mental Status: She is alert and oriented to person, place, and time.     Sensory: No sensory deficit.     Motor: No weakness.   Psychiatric:        Mood and Affect: Mood normal.        Behavior: Behavior normal.     BP (!) 144/86 (BP Location: Left Arm)   Pulse 77   Ht 5\' 7"  (1.702 m)   Wt 220 lb 9.6 oz (100.1 kg)   LMP 07/06/2013 Comment: serum pregnancy negative on 06/30/2013  SpO2 98%   BMI 34.55 kg/m  Wt Readings from Last 3 Encounters:  11/11/22 220 lb 9.6 oz (100.1 kg)  07/30/22 219 lb (99.3 kg)  01/30/22 221 lb (100.2 kg)    Lab Results  Component Value Date   TSH 2.820 11/07/2022   Lab Results  Component Value Date   WBC 6.2 01/24/2022   HGB 14.3 01/24/2022   HCT 41.0 01/24/2022   MCV 89 01/24/2022   PLT 269 01/24/2022   Lab Results  Component Value Date   NA 141 11/07/2022   K 4.3 11/07/2022   CO2 25 11/07/2022   GLUCOSE 98 11/07/2022   BUN 13 11/07/2022   CREATININE 0.80 11/07/2022   BILITOT 0.6 07/25/2022   ALKPHOS 69 07/25/2022   AST 23 07/25/2022   ALT 30 07/25/2022   PROT 6.1 07/25/2022   ALBUMIN 4.6 07/25/2022   CALCIUM 9.6 11/07/2022   ANIONGAP 8 02/01/2017   EGFR 86 11/07/2022   Lab Results  Component Value Date   CHOL 147 01/24/2022   Lab Results  Component Value Date   HDL 58 01/24/2022   Lab Results  Component Value Date   LDLCALC 75 01/24/2022   Lab Results  Component Value Date   TRIG 68 01/24/2022   Lab Results  Component Value Date   CHOLHDL 2.5 01/24/2022   Lab Results  Component Value  Date   HGBA1C 5.6 01/24/2022      Assessment & Plan:   Problem List Items Addressed This Visit    Hypothyroidism, adult Lab Results  Component Value Date   TSH 2.820 11/07/2022   with normal free T4 On Levothyroxine to 88 mcg QD, needs to take it on empty stomach Will recheck TSH and free T4 later  Essential hypertension BP Readings from Last 1 Encounters:  11/11/22 (!) 134/90   Uncontrolled with losartan 25 mg QD and hydrochlorothiazide 25 mg QD Increased dose of losartan to 50 mg QD Counseled for compliance with the medications Advised  DASH diet and moderate exercise/walking, at least 150 mins/week  Hyperlipidemia On Crestor Lipid profile reviewed  Obesity Advised to follow low-carb diet and perform moderate exercise as tolerated    Meds ordered this encounter  Medications   losartan (COZAAR) 50 MG tablet    Sig: Take 1 tablet (50 mg total) by mouth daily.    Dispense:  90 tablet    Refill:  0    Dose change - 11/11/22    Follow-up: Return in about 4 months (around 03/14/2023) for Annual physical.    Anabel Halon, MD

## 2022-11-11 NOTE — Assessment & Plan Note (Signed)
On Crestor Lipid profile reviewed 

## 2022-11-11 NOTE — Patient Instructions (Signed)
Please start taking Losartan 50 mg instead of 25 mg once daily.  Please continue to take medications as prescribed.  Please continue to follow low carb diet and perform moderate exercise/walking at least 150 mins/week.

## 2022-11-11 NOTE — Assessment & Plan Note (Signed)
 Advised to follow low-carb diet and perform moderate exercise as tolerated

## 2022-11-11 NOTE — Assessment & Plan Note (Addendum)
BP Readings from Last 1 Encounters:  11/11/22 (!) 134/90   Uncontrolled with losartan 25 mg QD and hydrochlorothiazide 25 mg QD Increased dose of losartan to 50 mg QD Counseled for compliance with the medications Advised DASH diet and moderate exercise/walking, at least 150 mins/week

## 2022-11-11 NOTE — Assessment & Plan Note (Signed)
Lab Results  Component Value Date   TSH 2.820 11/07/2022   with normal free T4 On Levothyroxine to 88 mcg QD, needs to take it on empty stomach Will recheck TSH and free T4 later

## 2022-12-25 ENCOUNTER — Other Ambulatory Visit: Payer: Self-pay | Admitting: Internal Medicine

## 2022-12-25 DIAGNOSIS — I10 Essential (primary) hypertension: Secondary | ICD-10-CM

## 2023-01-20 ENCOUNTER — Other Ambulatory Visit: Payer: Self-pay | Admitting: Internal Medicine

## 2023-01-20 DIAGNOSIS — E039 Hypothyroidism, unspecified: Secondary | ICD-10-CM

## 2023-03-08 ENCOUNTER — Encounter: Payer: Self-pay | Admitting: Internal Medicine

## 2023-03-09 ENCOUNTER — Other Ambulatory Visit: Payer: Self-pay | Admitting: Internal Medicine

## 2023-03-09 DIAGNOSIS — I10 Essential (primary) hypertension: Secondary | ICD-10-CM

## 2023-03-09 DIAGNOSIS — E559 Vitamin D deficiency, unspecified: Secondary | ICD-10-CM

## 2023-03-09 DIAGNOSIS — R739 Hyperglycemia, unspecified: Secondary | ICD-10-CM

## 2023-03-09 DIAGNOSIS — E782 Mixed hyperlipidemia: Secondary | ICD-10-CM

## 2023-03-09 DIAGNOSIS — E039 Hypothyroidism, unspecified: Secondary | ICD-10-CM

## 2023-03-11 LAB — LIPID PANEL
Chol/HDL Ratio: 2.6 ratio (ref 0.0–4.4)
Cholesterol, Total: 153 mg/dL (ref 100–199)
HDL: 59 mg/dL (ref >39)
LDL Chol Calc (NIH): 75 mg/dL (ref 0–99)
Triglycerides: 107 mg/dL (ref 0–149)
VLDL Cholesterol Cal: 19 mg/dL (ref 5–40)

## 2023-03-11 LAB — CMP14+EGFR
ALT: 37 [IU]/L — ABNORMAL HIGH (ref 0–32)
AST: 26 [IU]/L (ref 0–40)
Albumin: 4.5 g/dL (ref 3.8–4.9)
Alkaline Phosphatase: 69 [IU]/L (ref 44–121)
BUN/Creatinine Ratio: 9 (ref 9–23)
BUN: 7 mg/dL (ref 6–24)
Bilirubin Total: 0.6 mg/dL (ref 0.0–1.2)
CO2: 23 mmol/L (ref 20–29)
Calcium: 9.7 mg/dL (ref 8.7–10.2)
Chloride: 103 mmol/L (ref 96–106)
Creatinine, Ser: 0.77 mg/dL (ref 0.57–1.00)
Globulin, Total: 2 g/dL (ref 1.5–4.5)
Glucose: 92 mg/dL (ref 70–99)
Potassium: 3.8 mmol/L (ref 3.5–5.2)
Sodium: 141 mmol/L (ref 134–144)
Total Protein: 6.5 g/dL (ref 6.0–8.5)
eGFR: 90 mL/min/{1.73_m2} (ref >59)

## 2023-03-11 LAB — CBC WITH DIFFERENTIAL/PLATELET
Basophils Absolute: 0 10*3/uL (ref 0.0–0.2)
Basos: 1 %
EOS (ABSOLUTE): 0.1 10*3/uL (ref 0.0–0.4)
Eos: 2 %
Hematocrit: 40.6 % (ref 34.0–46.6)
Hemoglobin: 14 g/dL (ref 11.1–15.9)
Immature Grans (Abs): 0 10*3/uL (ref 0.0–0.1)
Immature Granulocytes: 0 %
Lymphocytes Absolute: 2.4 10*3/uL (ref 0.7–3.1)
Lymphs: 39 %
MCH: 31.5 pg (ref 26.6–33.0)
MCHC: 34.5 g/dL (ref 31.5–35.7)
MCV: 91 fL (ref 79–97)
Monocytes Absolute: 0.5 10*3/uL (ref 0.1–0.9)
Monocytes: 8 %
Neutrophils Absolute: 3.2 10*3/uL (ref 1.4–7.0)
Neutrophils: 50 %
Platelets: 271 10*3/uL (ref 150–450)
RBC: 4.45 x10E6/uL (ref 3.77–5.28)
RDW: 12.7 % (ref 11.7–15.4)
WBC: 6.3 10*3/uL (ref 3.4–10.8)

## 2023-03-11 LAB — TSH+FREE T4
Free T4: 1.16 ng/dL (ref 0.82–1.77)
TSH: 7.07 u[IU]/mL — ABNORMAL HIGH (ref 0.450–4.500)

## 2023-03-11 LAB — HEMOGLOBIN A1C
Est. average glucose Bld gHb Est-mCnc: 117 mg/dL
Hgb A1c MFr Bld: 5.7 % — ABNORMAL HIGH (ref 4.8–5.6)

## 2023-03-11 LAB — VITAMIN D 25 HYDROXY (VIT D DEFICIENCY, FRACTURES): Vit D, 25-Hydroxy: 60.7 ng/mL (ref 30.0–100.0)

## 2023-03-17 ENCOUNTER — Encounter: Payer: Self-pay | Admitting: Internal Medicine

## 2023-03-17 ENCOUNTER — Ambulatory Visit (INDEPENDENT_AMBULATORY_CARE_PROVIDER_SITE_OTHER): Payer: BC Managed Care – PPO | Admitting: Internal Medicine

## 2023-03-17 VITALS — BP 138/84 | HR 82 | Ht 67.0 in | Wt 225.8 lb

## 2023-03-17 DIAGNOSIS — Z23 Encounter for immunization: Secondary | ICD-10-CM | POA: Diagnosis not present

## 2023-03-17 DIAGNOSIS — I10 Essential (primary) hypertension: Secondary | ICD-10-CM | POA: Diagnosis not present

## 2023-03-17 DIAGNOSIS — Z0001 Encounter for general adult medical examination with abnormal findings: Secondary | ICD-10-CM

## 2023-03-17 DIAGNOSIS — E039 Hypothyroidism, unspecified: Secondary | ICD-10-CM

## 2023-03-17 DIAGNOSIS — J309 Allergic rhinitis, unspecified: Secondary | ICD-10-CM | POA: Insufficient documentation

## 2023-03-17 DIAGNOSIS — E782 Mixed hyperlipidemia: Secondary | ICD-10-CM

## 2023-03-17 MED ORDER — LEVOTHYROXINE SODIUM 100 MCG PO TABS: 100.0000 ug | ORAL_TABLET | Freq: Every day | ORAL | 1 refills | Status: DC

## 2023-03-17 NOTE — Progress Notes (Signed)
Established Patient Office Visit  Subjective:  Patient ID: Nancy [name], female    DOB: 05/01/1965  Age: 57 y.o. MRN: [id number]  CC:  Chief Complaint  Patient presents with   Annual Exam    HPI Nancy [name] is a 57 y.o. female with past medical history of HTN, HLD and hypothyroidism who presents for annual physical.  HTN: BP is well-controlled. Takes medications regularly. Patient denies headache, dizziness, chest pain, dyspnea or palpitations.   Hypothyroidism: She has been doing well with levothyroxine 88 mcg now.  Her TSH was elevated, but free T4 is normal now. She does not have any palpitations now. Of note, she used to take NP thyroid in the past.  She denies any fatigue, recent change in weight or appetite or tremors currently.  She has nasal congestion, postnasal drip and dry cough for the last 3 days. Denies any fever, chills, dyspnea or wheezing currently.  Past Medical History:  Diagnosis Date   Anxiety    Depression    Essential hypertension 05/01/2017   GERD (gastroesophageal reflux disease)    Hyperlipidemia 05/01/2017   Hypertension    Hypothyroidism, adult 03/02/2019   Irregular bleeding 03/10/2013   Thyroid disease    Phreesia 02/26/2020    Past Surgical History:  Procedure Laterality Date   ABDOMINAL HYSTERECTOMY     heavy irreg bleeding   CHOLECYSTECTOMY     COLONOSCOPY N/A 10/09/2016   Procedure: COLONOSCOPY;  Surgeon: Corbin Ade, MD;  Location: [facility];  Service: Endoscopy;  Laterality: N/A;  9:30 AM   DILATION AND CURETTAGE OF UTERUS     ENDOMETRIAL ABLATION     SALPINGOOPHORECTOMY Bilateral 07/06/2013   Procedure: SALPINGO OOPHORECTOMY;  Surgeon: Lazaro Arms, MD;  Location: [facility];  Service: Gynecology;  Laterality: Bilateral;   SUPRACERVICAL ABDOMINAL HYSTERECTOMY N/A 07/06/2013   Procedure: HYSTERECTOMY SUPRACERVICAL ABDOMINAL;  Surgeon: Lazaro Arms, MD;  Location: [facility];  Service: Gynecology;  Laterality: N/A;   TUBAL  LIGATION      Family History  Problem Relation Age of Onset   COPD Mother    Heart disease Mother        CHF   Hypertension Mother    Cancer Father        kidney met to lung   Heart attack Father 35       died of heart attack   Hypertension Maternal Grandmother    Heart disease Maternal Grandmother    Rheum arthritis Maternal Grandmother    Hypertension Maternal Grandfather    Heart disease Maternal Grandfather    Hypertension Paternal Grandmother    Heart disease Paternal Grandmother    Hypertension Paternal Grandfather    Heart disease Paternal Grandfather    Cancer Sister 18       lung,renal   Alcohol abuse Sister     Social History   Socioeconomic History   Marital status: Divorced    Spouse name: Not on file   Number of children: 2   Years of education: 16   Highest education level: Bachelor's degree (e.g., BA, AB, BS)  Occupational History   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 Second marriage.[employer], site.   2 daughters grown in [place]   7 grand and 1 great       Dog: LabDoodley: [place]      Enjoys: cruising, camp- being outside      Diet: eats all food groups    Caffeine: coffee- 16 oz; 16 oz   Water: 6-8 cups       Wears seat belt   Does not use phone while driving   Smoke Barrister's clerk in lock box   Social Determinants of Health   Financial Resource Strain: Medium Risk (07/29/2022)   Overall Financial Resource Strain (CARDIA)    Difficulty of Paying Living Expenses: Somewhat hard  Food Insecurity: No Food Insecurity (07/29/2022)   Hunger Vital Sign     Worried About Running Out of Food in the Last Year: Never true    Ran Out of Food in the Last Year: Never true  Transportation Needs: No Transportation Needs (07/29/2022)   PRAPARE - Administrator, Civil Service (Medical): No    Lack of Transportation (Non-Medical): No  Physical Activity: Insufficiently Active (07/29/2022)   Exercise Vital Sign    Days of Exercise per Week: 1 day    Minutes of Exercise per Session: 20 min  Stress: No Stress Concern Present (08/31/2020)   [employer] of Occupational Health - Occupational Stress Questionnaire    Feeling of Stress : Only a little  Social Connections: Moderately Isolated (07/29/2022)   Social Connection and Isolation Panel [NHANES]    Frequency of Communication with Friends and Family: More than three times a week    Frequency of Social Gatherings with Friends and Family: Once a week    Attends Religious Services: 1 to 4 times per year    Active Member of [employer] or Organizations: No    Attends Engineer, structural: Not on file    Marital Status: Divorced  Intimate Partner Violence: Not At Risk (02/28/2020)   Humiliation, Afraid, Rape, and Kick questionnaire    Fear of Current or Ex-Partner: No    Emotionally Abused: No    Physically Abused: No    Sexually Abused: No    Outpatient Medications Prior to Visit  Medication Sig Dispense Refill   Cholecalciferol (VITAMIN D-3) 125 MCG (5000 UT) TABS Take 1 tablet by mouth daily.     hydrochlorothiazide (HYDRODIURIL) 25 MG tablet TAKE 1 TABLET BY MOUTH DAILY. 90 tablet 0   losartan (COZAAR) 50 MG tablet Take 1 tablet (50 mg total) by mouth daily. 90 tablet 0   rizatriptan (MAXALT-MLT) 10 MG disintegrating tablet Take 1 tablet (10 mg total) by mouth as needed for migraine. May repeat in 2 hours if needed. Max of 2 tablets in 24 hour period. 10 tablet 0   rosuvastatin (CRESTOR) 10 MG tablet Take 1 tablet (10 mg total) by mouth daily. 90 tablet 3   levothyroxine (SYNTHROID) 88  MCG tablet TAKE ONE TABLET BY MOUTH ONCE DAILY. 90 tablet 1   No facility-administered medications prior to visit.    No Known Allergies  ROS Review of Systems  Constitutional:  Negative for chills and fever.  HENT:  Positive for congestion, postnasal drip and sinus pressure. Negative for sinus pain and sore throat.   Eyes:  Negative for pain and discharge.  Respiratory:  Negative for cough and shortness of breath.  Cardiovascular:  Negative for chest pain and palpitations.  Gastrointestinal:  Negative for abdominal pain, constipation, diarrhea, nausea and vomiting.  Endocrine: Negative for polydipsia and polyuria.  Genitourinary:  Negative for dysuria and hematuria.  Musculoskeletal:  Negative for neck pain and neck stiffness.  Skin:  Negative for rash.  Neurological:  Negative for dizziness and weakness.  Psychiatric/Behavioral:  Negative for agitation and behavioral problems.       Objective:    Physical Exam Vitals reviewed.  Constitutional:      General: She is not in acute distress.    Appearance: She is not diaphoretic.  HENT:     Head: Normocephalic and atraumatic.     Nose: Congestion present.     Right Sinus: Frontal sinus tenderness present.     Left Sinus: Frontal sinus tenderness present.     Mouth/Throat:     Mouth: Mucous membranes are moist.     Pharynx: No posterior oropharyngeal erythema.  Eyes:     General: No scleral icterus.    Extraocular Movements: Extraocular movements intact.  Cardiovascular:     Rate and Rhythm: Normal rate and regular rhythm.     Pulses: Normal pulses.     Heart sounds: Normal heart sounds. No murmur heard. Pulmonary:     Breath sounds: Normal breath sounds. No wheezing or rales.  Abdominal:     Palpations: Abdomen is soft.     Tenderness: There is no abdominal tenderness.  Musculoskeletal:     Cervical back: Neck supple. No tenderness.     Right lower leg: No edema.     Left lower leg: No edema.  Skin:    General:  Skin is warm.     Findings: No rash.  Neurological:     General: No focal deficit present.     Mental Status: She is alert and oriented to person, place, and time.     Cranial Nerves: No cranial nerve deficit.     Sensory: No sensory deficit.     Motor: No weakness.  Psychiatric:        Mood and Affect: Mood normal.        Behavior: Behavior normal.     BP 138/84 (BP Location: Left Arm) Comment: At home  Pulse 82   Ht 5\' 7"  (1.702 m)   Wt 225 lb 12.8 oz (102.4 kg)   LMP 07/06/2013 Comment: serum pregnancy negative on 06/30/2013  SpO2 98%   BMI 35.37 kg/m  Wt Readings from Last 3 Encounters:  03/17/23 225 lb 12.8 oz (102.4 kg)  11/11/22 220 lb 9.6 oz (100.1 kg)  07/30/22 219 lb (99.3 kg)    Lab Results  Component Value Date   TSH 7.070 (H) 03/10/2023   Lab Results  Component Value Date   WBC 6.3 03/10/2023   HGB 14.0 03/10/2023   HCT 40.6 03/10/2023   MCV 91 03/10/2023   PLT 271 03/10/2023   Lab Results  Component Value Date   NA 141 03/10/2023   K 3.8 03/10/2023   CO2 23 03/10/2023   GLUCOSE 92 03/10/2023   BUN 7 03/10/2023   CREATININE 0.77 03/10/2023   BILITOT 0.6 03/10/2023   ALKPHOS 69 03/10/2023   AST 26 03/10/2023   ALT 37 (H) 03/10/2023   PROT 6.5 03/10/2023   ALBUMIN 4.5 03/10/2023   CALCIUM 9.7 03/10/2023   ANIONGAP 8 02/01/2017   EGFR 90 03/10/2023   Lab Results  Component Value Date   CHOL 153 03/10/2023   Lab Results  Component Value Date   HDL 59 03/10/2023   Lab Results  Component Value Date   LDLCALC 75 03/10/2023   Lab Results  Component Value Date   TRIG 107 03/10/2023   Lab Results  Component Value Date   CHOLHDL 2.6 03/10/2023   Lab Results  Component Value Date   HGBA1C 5.7 (H) 03/10/2023      Assessment & Plan:   Problem List Items Addressed This Visit       Cardiovascular and Mediastinum   Essential hypertension    BP Readings from Last 1 Encounters:  03/17/23 138/84   Well-controlled with losartan 50  mg QD and hydrochlorothiazide 25 mg QD Counseled for compliance with the medications Advised DASH diet and moderate exercise/walking, at least 150 mins/week        Respiratory   Allergic sinusitis    Recent onset symptoms likely due to recent change in weather/allergies Advised to use Flonase Can take Dayquil/Nyquil PRN for nasal congestion        Endocrine   Hypothyroidism, adult    Lab Results  Component Value Date   TSH 7.070 (H) 03/10/2023   with normal free T4 On Levothyroxine to 88 mcg QD, takes it on empty stomach - increased dose of levothyroxine to 100 mcg QD Will recheck TSH and free T4 later      Relevant Medications   levothyroxine (SYNTHROID) 100 MCG tablet   Other Relevant Orders   TSH + free T4     Other   Hyperlipidemia    On Crestor Lipid profile reviewed      Encounter for general adult medical examination with abnormal findings - Primary    Physical examination as documented. Fasting blood tests reviewed. Flu vaccine today. Wants to think about Shingrix vaccine for now.      Other Visit Diagnoses     Encounter for immunization       Relevant Orders   Flu vaccine trivalent PF, 6mos and older(Flulaval,Afluria,Fluarix,Fluzone) (Completed)       Meds ordered this encounter  Medications   levothyroxine (SYNTHROID) 100 MCG tablet    Sig: Take 1 tablet (100 mcg total) by mouth daily.    Dispense:  90 tablet    Refill:  1    DOSE CHANGE - 03/17/23    Follow-up: Return in about 4 months (around 07/15/2023) for Hypothyroidism.    Anabel Halon, MD

## 2023-03-17 NOTE — Assessment & Plan Note (Addendum)
Lab Results  Component Value Date   TSH 7.070 (H) 03/10/2023   with normal free T4 On Levothyroxine to 88 mcg QD, takes it on empty stomach - increased dose of levothyroxine to 100 mcg QD Will recheck TSH and free T4 later

## 2023-03-17 NOTE — Assessment & Plan Note (Signed)
Physical examination as documented. Fasting blood tests reviewed. Flu vaccine today. Wants to think about Shingrix vaccine for now.

## 2023-03-17 NOTE — Assessment & Plan Note (Signed)
Recent onset symptoms likely due to recent change in weather/allergies Advised to use Flonase Can take Dayquil/Nyquil PRN for nasal congestion

## 2023-03-17 NOTE — Assessment & Plan Note (Signed)
On Crestor Lipid profile reviewed 

## 2023-03-17 NOTE — Patient Instructions (Signed)
Please start taking Levothyroxine 100 mcg once daily.  Please continue to take medications as prescribed.  Please continue to follow low salt diet and perform moderate exercise/walking at least 150 mins/week.  Please get fasting blood tests done before the next visit.

## 2023-03-17 NOTE — Assessment & Plan Note (Addendum)
BP Readings from Last 1 Encounters:  03/17/23 138/84   Well-controlled with losartan 50 mg QD and hydrochlorothiazide 25 mg QD Counseled for compliance with the medications Advised DASH diet and moderate exercise/walking, at least 150 mins/week

## 2023-03-18 ENCOUNTER — Encounter: Payer: Self-pay | Admitting: Internal Medicine

## 2023-03-19 ENCOUNTER — Other Ambulatory Visit: Payer: Self-pay | Admitting: Internal Medicine

## 2023-03-19 DIAGNOSIS — I10 Essential (primary) hypertension: Secondary | ICD-10-CM

## 2023-03-27 ENCOUNTER — Other Ambulatory Visit: Payer: Self-pay | Admitting: Internal Medicine

## 2023-03-27 DIAGNOSIS — I10 Essential (primary) hypertension: Secondary | ICD-10-CM

## 2023-03-27 DIAGNOSIS — E782 Mixed hyperlipidemia: Secondary | ICD-10-CM

## 2023-03-30 ENCOUNTER — Other Ambulatory Visit (HOSPITAL_COMMUNITY): Payer: Self-pay | Admitting: Internal Medicine

## 2023-03-30 DIAGNOSIS — Z1231 Encounter for screening mammogram for malignant neoplasm of breast: Secondary | ICD-10-CM

## 2023-05-07 ENCOUNTER — Encounter: Payer: Self-pay | Admitting: Internal Medicine

## 2023-05-11 ENCOUNTER — Ambulatory Visit (HOSPITAL_COMMUNITY)
Admission: RE | Admit: 2023-05-11 | Discharge: 2023-05-11 | Disposition: A | Discharge status: Home / Self Care | Payer: 59 | Source: Ambulatory Visit | Attending: Internal Medicine | Admitting: Internal Medicine

## 2023-05-11 DIAGNOSIS — Z1231 Encounter for screening mammogram for malignant neoplasm of breast: Secondary | ICD-10-CM | POA: Diagnosis present

## 2023-06-16 ENCOUNTER — Other Ambulatory Visit: Payer: Self-pay | Admitting: Internal Medicine

## 2023-06-16 DIAGNOSIS — I10 Essential (primary) hypertension: Secondary | ICD-10-CM

## 2023-06-22 ENCOUNTER — Other Ambulatory Visit: Payer: Self-pay | Admitting: Internal Medicine

## 2023-06-22 DIAGNOSIS — I10 Essential (primary) hypertension: Secondary | ICD-10-CM

## 2023-06-22 DIAGNOSIS — E782 Mixed hyperlipidemia: Secondary | ICD-10-CM

## 2023-07-09 LAB — TSH+FREE T4
Free T4: 1.3 ng/dL (ref 0.82–1.77)
TSH: 4.31 u[IU]/mL (ref 0.450–4.500)

## 2023-07-13 ENCOUNTER — Encounter: Payer: Self-pay | Admitting: Internal Medicine

## 2023-07-13 ENCOUNTER — Ambulatory Visit: Payer: BC Managed Care – PPO | Admitting: Internal Medicine

## 2023-07-13 VITALS — BP 130/84 | HR 78 | Ht 67.0 in | Wt 219.8 lb

## 2023-07-13 DIAGNOSIS — E782 Mixed hyperlipidemia: Secondary | ICD-10-CM

## 2023-07-13 DIAGNOSIS — E039 Hypothyroidism, unspecified: Secondary | ICD-10-CM | POA: Diagnosis not present

## 2023-07-13 DIAGNOSIS — I10 Essential (primary) hypertension: Secondary | ICD-10-CM

## 2023-07-13 MED ORDER — LOSARTAN POTASSIUM 50 MG PO TABS: 50.0000 mg | ORAL_TABLET | Freq: Every day | ORAL | 3 refills | Status: DC

## 2023-07-13 MED ORDER — HYDROCHLOROTHIAZIDE 25 MG PO TABS: 25.0000 mg | ORAL_TABLET | Freq: Every day | ORAL | 3 refills | Status: DC

## 2023-07-13 MED ORDER — ROSUVASTATIN CALCIUM 10 MG PO TABS
10.0000 mg | ORAL_TABLET | Freq: Every day | ORAL | 3 refills | Status: DC
Start: 2023-07-13 — End: 2024-03-22

## 2023-07-13 MED ORDER — LEVOTHYROXINE SODIUM 100 MCG PO TABS: 100.0000 ug | ORAL_TABLET | Freq: Every day | ORAL | 2 refills | Status: DC

## 2023-07-13 NOTE — Patient Instructions (Signed)
 Please continue to take medications as prescribed.  Please continue to follow low carb diet and perform moderate exercise/walking at least 150 mins/week.

## 2023-07-13 NOTE — Assessment & Plan Note (Signed)
On Crestor Lipid profile reviewed 

## 2023-07-13 NOTE — Assessment & Plan Note (Signed)
 Lab Results  Component Value Date   TSH 4.310 07/08/2023   with normal free T4 On Levothyroxine to 100 mcg QD, takes it on empty stomach - increased dose of levothyroxine to 100 mcg once daily in the last visit Will recheck TSH and free T4 later

## 2023-07-13 NOTE — Progress Notes (Signed)
 Established Patient Office Visit  Subjective:  Patient ID: Nancy [name], female    DOB: 05/11/1965  Age: 58 y.o. MRN: [id number]  CC:  Chief Complaint  Patient presents with   Care Management    4 month f/u    HPI Nancy [name] is a 58 y.o. female with past medical history of HTN, HLD and hypothyroidism who presents for f/u of her chronic medical conditions.  HTN: BP is well-controlled. Takes medications regularly. Patient denies headache, dizziness, chest pain, dyspnea or palpitations.   Hypothyroidism: She has been doing well with levothyroxine 100 mcg now.  Her TSH and free T4 are normal now. She does not have any palpitations now. Of note, she used to take NP thyroid in the past.  She denies any fatigue, recent change in appetite or tremors currently. She has lost about 6 lbs since the last visit, but reports having gastroenteritis-like symptoms in the last week.  Past Medical History:  Diagnosis Date   Anxiety    Depression    Essential hypertension 05/01/2017   GERD (gastroesophageal reflux disease)    Hyperlipidemia 05/01/2017   Hypertension    Hypothyroidism, adult 03/02/2019   Irregular bleeding 03/10/2013   Thyroid disease    Phreesia 02/26/2020    Past Surgical History:  Procedure Laterality Date   ABDOMINAL HYSTERECTOMY     heavy irreg bleeding   CHOLECYSTECTOMY     COLONOSCOPY N/A 10/09/2016   Procedure: COLONOSCOPY;  Surgeon: Corbin Ade, MD;  Location[facility];  Service: Endoscopy;  Laterality: N/A;  9:30 AM   DILATION AND CURETTAGE OF UTERUS     ENDOMETRIAL ABLATION     SALPINGOOPHORECTOMY Bilateral 07/06/2013   Procedure: SALPINGO OOPHORECTOMY;  Surgeon: Lazaro Arms, MD;  Location: [facility];  Service: Gynecology;  Laterality: Bilateral;   SUPRACERVICAL ABDOMINAL HYSTERECTOMY N/A 07/06/2013   Procedure: HYSTERECTOMY SUPRACERVICAL ABDOMINAL;  Surgeon: Lazaro Arms, MD;  Location: [facility];  Service: Gynecology;  Laterality: N/A;   TUBAL  LIGATION      Family History  Problem Relation Age of Onset   COPD Mother    Heart disease Mother        CHF   Hypertension Mother    Cancer Father        kidney met to lung   Heart attack Father 4       died of heart attack   Hypertension Maternal Grandmother    Heart disease Maternal Grandmother    Rheum arthritis Maternal Grandmother    Hypertension Maternal Grandfather    Heart disease Maternal Grandfather    Hypertension Paternal Grandmother    Heart disease Paternal Grandmother    Hypertension Paternal Grandfather    Heart disease Paternal Grandfather    Cancer Sister 44       lung,renal   Alcohol abuse Sister     Social History   Socioeconomic History   Marital status: Divorced    Spouse name: Not on file   Number of children: 2   Years of education: 16   Highest education level: Bachelor's degree (e.g., BA, AB, BS)  Occupational History   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 Second marriage.[employer], site.   2 daughters grown in [place]   7 grand and 1 great       Dog: LabDoodley: Liberty      Enjoys: cruising, camp- being outside      Diet: eats all food groups    Caffeine: coffee- 16 oz; 16 oz   Water: 6-8 cups       Wears seat belt   Does not use phone while driving   Smoke Barrister's clerk in lock box   Social Drivers of Health   Financial Resource Strain: Medium Risk (07/29/2022)   Overall Financial Resource Strain (CARDIA)    Difficulty of Paying Living Expenses: Somewhat hard  Food Insecurity: No Food Insecurity (07/29/2022)   Hunger Vital Sign     Worried About Running Out of Food in the Last Year: Never true    Ran Out of Food in the Last Year: Never true  Transportation Needs: No Transportation Needs (07/29/2022)   PRAPARE - Administrator, Civil Service (Medical): No    Lack of Transportation (Non-Medical): No  Physical Activity: Insufficiently Active (07/29/2022)   Exercise Vital Sign    Days of Exercise per Week: 1 day    Minutes of Exercise per Session: 20 min  Stress: No Stress Concern Present (08/31/2020)   [employer] of Occupational Health - Occupational Stress Questionnaire    Feeling of Stress : Only a little  Social Connections: Moderately Isolated (07/29/2022)   Social Connection and Isolation Panel [NHANES]    Frequency of Communication with Friends and Family: More than three times a week    Frequency of Social Gatherings with Friends and Family: Once a week    Attends Religious Services: 1 to 4 times per year    Active Member of [employer] or Organizations: No    Attends Engineer, structural: Not on file    Marital Status: Divorced  Intimate Partner Violence: Not At Risk (02/28/2020)   Humiliation, Afraid, Rape, and Kick questionnaire    Fear of Current or Ex-Partner: No    Emotionally Abused: No    Physically Abused: No    Sexually Abused: No    Outpatient Medications Prior to Visit  Medication Sig Dispense Refill   Cholecalciferol (VITAMIN D-3) 125 MCG (5000 UT) TABS Take 1 tablet by mouth daily.     rizatriptan (MAXALT-MLT) 10 MG disintegrating tablet Take 1 tablet (10 mg total) by mouth as needed for migraine. May repeat in 2 hours if needed. Max of 2 tablets in 24 hour period. 10 tablet 0   hydrochlorothiazide (HYDRODIURIL) 25 MG tablet TAKE 1 TABLET BY MOUTH DAILY. 90 tablet 0   levothyroxine (SYNTHROID) 100 MCG tablet Take 1 tablet (100 mcg total) by mouth daily. 90 tablet 1   losartan (COZAAR) 50 MG tablet TAKE (1) TABLET BY MOUTH ONCE DAILY. 90 tablet 0   rosuvastatin (CRESTOR) 10 MG  tablet TAKE ONE TABLET BY MOUTH ONCE DAILY. 90 tablet 0   No facility-administered medications prior to visit.    No Known Allergies  ROS Review of Systems  Constitutional:  Negative for chills and fever.  HENT:  Negative for congestion, sinus pressure, sinus pain and sore throat.   Eyes:  Negative for pain and discharge.  Respiratory:  Negative for cough and shortness of breath.   Cardiovascular:  Negative  for chest pain and palpitations.  Gastrointestinal:  Negative for abdominal pain, constipation, diarrhea, nausea and vomiting.  Endocrine: Negative for polydipsia and polyuria.  Genitourinary:  Negative for dysuria and hematuria.  Musculoskeletal:  Negative for neck pain and neck stiffness.  Skin:  Negative for rash.  Neurological:  Negative for dizziness and weakness.  Psychiatric/Behavioral:  Negative for agitation and behavioral problems.       Objective:    Physical Exam Vitals reviewed.  Constitutional:      General: She is not in acute distress.    Appearance: She is not diaphoretic.  HENT:     Head: Normocephalic and atraumatic.     Nose: No congestion.     Mouth/Throat:     Mouth: Mucous membranes are moist.     Pharynx: No posterior oropharyngeal erythema.  Eyes:     General: No scleral icterus.    Extraocular Movements: Extraocular movements intact.  Cardiovascular:     Rate and Rhythm: Normal rate and regular rhythm.     Heart sounds: Normal heart sounds. No murmur heard. Pulmonary:     Breath sounds: Normal breath sounds. No wheezing or rales.  Musculoskeletal:     Cervical back: Neck supple. No tenderness.     Right lower leg: No edema.     Left lower leg: No edema.  Skin:    General: Skin is warm.     Findings: No rash.  Neurological:     General: No focal deficit present.     Mental Status: She is alert and oriented to person, place, and time.     Sensory: No sensory deficit.     Motor: No weakness.  Psychiatric:        Mood and Affect: Mood  normal.        Behavior: Behavior normal.     BP 130/84   Pulse 78   Ht 5\' 7"  (1.702 m)   Wt 219 lb 12.8 oz (99.7 kg)   LMP 07/06/2013 Comment: serum pregnancy negative on 06/30/2013  SpO2 98%   BMI 34.43 kg/m  Wt Readings from Last 3 Encounters:  07/13/23 219 lb 12.8 oz (99.7 kg)  03/17/23 225 lb 12.8 oz (102.4 kg)  11/11/22 220 lb 9.6 oz (100.1 kg)    Lab Results  Component Value Date   TSH 4.310 07/08/2023   Lab Results  Component Value Date   WBC 6.3 03/10/2023   HGB 14.0 03/10/2023   HCT 40.6 03/10/2023   MCV 91 03/10/2023   PLT 271 03/10/2023   Lab Results  Component Value Date   NA 141 03/10/2023   K 3.8 03/10/2023   CO2 23 03/10/2023   GLUCOSE 92 03/10/2023   BUN 7 03/10/2023   CREATININE 0.77 03/10/2023   BILITOT 0.6 03/10/2023   ALKPHOS 69 03/10/2023   AST 26 03/10/2023   ALT 37 (H) 03/10/2023   PROT 6.5 03/10/2023   ALBUMIN 4.5 03/10/2023   CALCIUM 9.7 03/10/2023   ANIONGAP 8 02/01/2017   EGFR 90 03/10/2023   Lab Results  Component Value Date   CHOL 153 03/10/2023   Lab Results  Component Value Date   HDL 59 03/10/2023   Lab Results  Component Value Date   LDLCALC 75 03/10/2023   Lab Results  Component Value Date   TRIG 107 03/10/2023   Lab Results  Component Value Date   CHOLHDL 2.6 03/10/2023   Lab Results  Component Value Date   HGBA1C 5.7 (H) 03/10/2023  Assessment & Plan:   Problem List Items Addressed This Visit       Cardiovascular and Mediastinum   Essential hypertension   BP Readings from Last 1 Encounters:  07/13/23 130/84   Well-controlled with losartan 50 mg QD and hydrochlorothiazide 25 mg QD Counseled for compliance with the medications Advised DASH diet and moderate exercise/walking, at least 150 mins/week      Relevant Medications   rosuvastatin (CRESTOR) 10 MG tablet   losartan (COZAAR) 50 MG tablet   hydrochlorothiazide (HYDRODIURIL) 25 MG tablet     Endocrine   Hypothyroidism, adult -  Primary   Lab Results  Component Value Date   TSH 4.310 07/08/2023   with normal free T4 On Levothyroxine to 100 mcg QD, takes it on empty stomach - increased dose of levothyroxine to 100 mcg once daily in the last visit Will recheck TSH and free T4 later      Relevant Medications   levothyroxine (SYNTHROID) 100 MCG tablet     Other   Hyperlipidemia   On Crestor Lipid profile reviewed      Relevant Medications   rosuvastatin (CRESTOR) 10 MG tablet   losartan (COZAAR) 50 MG tablet   hydrochlorothiazide (HYDRODIURIL) 25 MG tablet     Meds ordered this encounter  Medications   rosuvastatin (CRESTOR) 10 MG tablet    Sig: Take 1 tablet (10 mg total) by mouth daily.    Dispense:  90 tablet    Refill:  3   losartan (COZAAR) 50 MG tablet    Sig: Take 1 tablet (50 mg total) by mouth daily.    Dispense:  90 tablet    Refill:  3   hydrochlorothiazide (HYDRODIURIL) 25 MG tablet    Sig: Take 1 tablet (25 mg total) by mouth daily.    Dispense:  90 tablet    Refill:  3   levothyroxine (SYNTHROID) 100 MCG tablet    Sig: Take 1 tablet (100 mcg total) by mouth daily.    Dispense:  90 tablet    Refill:  2    DOSE CHANGE - 03/17/23    Follow-up: Return in about 8 months (around 03/14/2024) for Annual physical (after 03/16/24).    Anabel Halon, MD

## 2023-07-13 NOTE — Assessment & Plan Note (Signed)
 BP Readings from Last 1 Encounters:  07/13/23 130/84   Well-controlled with losartan 50 mg QD and hydrochlorothiazide 25 mg QD Counseled for compliance with the medications Advised DASH diet and moderate exercise/walking, at least 150 mins/week

## 2023-09-03 ENCOUNTER — Encounter

## 2023-09-11 ENCOUNTER — Telehealth: Admitting: Nurse Practitioner

## 2023-09-11 DIAGNOSIS — B372 Candidiasis of skin and nail: Secondary | ICD-10-CM

## 2023-09-11 MED ORDER — FLUCONAZOLE 150 MG PO TABS: ORAL_TABLET | ORAL | 0 refills | Status: DC

## 2023-09-11 MED ORDER — NYSTATIN 100000 UNIT/GM EX CREA: 1.0000 | TOPICAL_CREAM | Freq: Two times a day (BID) | CUTANEOUS | 1 refills | Status: DC

## 2023-09-11 NOTE — Progress Notes (Signed)
 E Visit for Rash  We are sorry that you are not feeling well. Here is how we plan to help! It sounds like this rash might be from yeast. We will prescribe an oral medication to take 3 times over the course of a week. Take one tablet every 72 hours for three total doses. We will also give you a cream to apply to the rash that is anti-fungal. Please stop the steroid creams   Try to keep the area dry when possible, change clothes often if sweaty  Meds ordered this encounter  Medications   fluconazole (DIFLUCAN) 150 MG tablet    Sig: Take one tablet today, repeat after 72 hours x2 for a total of 3 doses over the course of one week.    Dispense:  3 tablet    Refill:  0   nystatin cream (MYCOSTATIN)    Sig: Apply 1 Application topically 2 (two) times daily.    Dispense:  30 g    Refill:  1     Nystatin cream apply to the affected area twice daily     GET HELP RIGHT AWAY IF:  Symptoms don't go away after treatment. Severe itching that persists. If you rash spreads or swells. If you rash begins to smell. If it blisters and opens or develops a yellow-brown crust. You develop a fever. You have a sore throat. You become short of breath.  MAKE SURE YOU:  Understand these instructions. Will watch your condition. Will get help right away if you are not doing well or get worse.  Thank you for choosing an e-visit.  Your e-visit answers were reviewed by a board certified advanced clinical practitioner to complete your personal care plan. Depending upon the condition, your plan could have included both over the counter or prescription medications.  Please review your pharmacy choice. Make sure the pharmacy is open so you can pick up prescription now. If there is a problem, you may contact your provider through [employer] and have the prescription routed to another pharmacy.  Your safety is important to us . If you have drug allergies check your prescription carefully.   For the next  24 hours you can use MyChart to ask questions about today's visit, request a non-urgent call back, or ask for a work or school excuse. You will get an email in the next two days asking about your experience. I hope that your e-visit has been valuable and will speed your recovery.  I spent approximately 5 minutes reviewing the patient's history, current symptoms and coordinating their care today.

## 2023-10-17 ENCOUNTER — Other Ambulatory Visit: Payer: Self-pay | Admitting: Nurse Practitioner

## 2023-10-17 DIAGNOSIS — B372 Candidiasis of skin and nail: Secondary | ICD-10-CM

## 2023-11-08 ENCOUNTER — Emergency Department (HOSPITAL_COMMUNITY)
Admission: EM | Admit: 2023-11-08 | Discharge: 2023-11-08 | Disposition: A | Discharge status: Home / Self Care | Attending: Emergency Medicine | Admitting: Emergency Medicine

## 2023-11-08 ENCOUNTER — Other Ambulatory Visit: Payer: Self-pay

## 2023-11-08 ENCOUNTER — Emergency Department (HOSPITAL_COMMUNITY)

## 2023-11-08 ENCOUNTER — Encounter (HOSPITAL_COMMUNITY): Payer: Self-pay

## 2023-11-08 DIAGNOSIS — R55 Syncope and collapse: Secondary | ICD-10-CM | POA: Diagnosis not present

## 2023-11-08 DIAGNOSIS — R42 Dizziness and giddiness: Secondary | ICD-10-CM | POA: Insufficient documentation

## 2023-11-08 DIAGNOSIS — I10 Essential (primary) hypertension: Secondary | ICD-10-CM | POA: Diagnosis not present

## 2023-11-08 DIAGNOSIS — E039 Hypothyroidism, unspecified: Secondary | ICD-10-CM | POA: Insufficient documentation

## 2023-11-08 LAB — COMPREHENSIVE METABOLIC PANEL WITH GFR
ALT: 41 U/L (ref 0–44)
AST: 32 U/L (ref 15–41)
Albumin: 4.4 g/dL (ref 3.5–5.0)
Alkaline Phosphatase: 58 U/L (ref 38–126)
Anion gap: 13 (ref 5–15)
BUN: 14 mg/dL (ref 6–20)
CO2: 22 mmol/L (ref 22–32)
Calcium: 9.6 mg/dL (ref 8.9–10.3)
Chloride: 99 mmol/L (ref 98–111)
Creatinine, Ser: 0.64 mg/dL (ref 0.44–1.00)
GFR, Estimated: >60 mL/min (ref >60)
Glucose, Bld: 140 mg/dL — ABNORMAL HIGH (ref 70–99)
Potassium: 3.6 mmol/L (ref 3.5–5.1)
Sodium: 134 mmol/L — ABNORMAL LOW (ref 135–145)
Total Bilirubin: 0.9 mg/dL (ref 0.0–1.2)
Total Protein: 7.3 g/dL (ref 6.5–8.1)

## 2023-11-08 LAB — CBC
HCT: 43.1 % (ref 36.0–46.0)
Hemoglobin: 15.6 g/dL — ABNORMAL HIGH (ref 12.0–15.0)
MCH: 31.3 pg (ref 26.0–34.0)
MCHC: 36.2 g/dL — ABNORMAL HIGH (ref 30.0–36.0)
MCV: 86.5 fL (ref 80.0–100.0)
Platelets: 275 K/uL (ref 150–400)
RBC: 4.98 MIL/uL (ref 3.87–5.11)
RDW: 12.6 % (ref 11.5–15.5)
WBC: 10.2 K/uL (ref 4.0–10.5)
nRBC: 0 % (ref 0.0–0.2)

## 2023-11-08 LAB — CBG MONITORING, ED: Glucose-Capillary: 137 mg/dL — ABNORMAL HIGH (ref 70–99)

## 2023-11-08 LAB — MAGNESIUM: Magnesium: 2.2 mg/dL (ref 1.7–2.4)

## 2023-11-08 MED ORDER — LACTATED RINGERS IV BOLUS
1000.0000 mL | Freq: Once | INTRAVENOUS | Status: AC
  Administered 2023-11-08: 1000 mL via INTRAVENOUS

## 2023-11-08 MED ORDER — DIPHENHYDRAMINE HCL 50 MG/ML IJ SOLN
25.0000 mg | Freq: Once | INTRAMUSCULAR | Status: AC
  Administered 2023-11-08: 25 mg via INTRAVENOUS
  Filled 2023-11-08: qty 1

## 2023-11-08 MED ORDER — SODIUM CHLORIDE 0.9 % IV BOLUS
1000.0000 mL | Freq: Once | INTRAVENOUS | Status: AC
  Administered 2023-11-08: 1000 mL via INTRAVENOUS

## 2023-11-08 MED ORDER — LORAZEPAM 2 MG/ML IJ SOLN
1.0000 mg | Freq: Once | INTRAMUSCULAR | Status: DC
  Filled 2023-11-08: qty 1

## 2023-11-08 MED ORDER — MECLIZINE HCL 12.5 MG PO TABS
25.0000 mg | ORAL_TABLET | Freq: Once | ORAL | Status: AC
  Administered 2023-11-08: 25 mg via ORAL
  Filled 2023-11-08: qty 2

## 2023-11-08 MED ORDER — PROCHLORPERAZINE EDISYLATE 10 MG/2ML IJ SOLN
10.0000 mg | Freq: Once | INTRAMUSCULAR | Status: AC
  Administered 2023-11-08: 10 mg via INTRAVENOUS
  Filled 2023-11-08: qty 2

## 2023-11-08 MED ORDER — MECLIZINE HCL 25 MG PO TABS: 25.0000 mg | ORAL_TABLET | Freq: Three times a day (TID) | ORAL | 0 refills | Status: AC | PRN

## 2023-11-08 NOTE — Discharge Instructions (Signed)
 Increase your fluid intake.  You were offered an MRI and further workup to rule out stroke in the ER and you declined.  If you change your mind return to the ER.  If your symptoms worsen return to the ER.  Is important that you call and follow-up with primary care.  Call the office tomorrow to make an appointment.  You can take your meclizine  as needed for dizziness and nausea.

## 2023-11-08 NOTE — ED Triage Notes (Signed)
 Pt presents with emesis and dizziness after waking up this morning. Pt states she feels like she is going to pass out and awoke this morning diaphoretic. Not able to walk she states, only crawl due to weakness.

## 2023-11-08 NOTE — ED Provider Notes (Signed)
 [facility] Emergency Department Provider Note MRN:  [id number]  Arrival date & time: 11/08/23     Chief Complaint   Dizziness and Emesis   History of Present Illness   Nancy [name] is a 58 y.o. year-old female with a history of hypertension presenting to the ED with chief complaint of dizziness and emesis.  Woke up and tried to stand up and go use the bathroom.  Became severely dizzy, described as a lightheadedness.  Began feeling really unwell, sweaty, could not stand up, had to crawl around her house.  Has had very poor appetite for the past 2 days, nauseated with little appetite.  Denies numbness or weakness to the arms or legs, not having any chest pain or shortness of breath, no abdominal pain, no fever.  Review of Systems  A thorough review of systems was obtained and all systems are negative except as noted in the HPI and PMH.   Patient's Health History    Past Medical History:  Diagnosis Date   Anxiety    Depression    Essential hypertension 05/01/2017   GERD (gastroesophageal reflux disease)    Hyperlipidemia 05/01/2017   Hypertension    Hypothyroidism, adult 03/02/2019   Irregular bleeding 03/10/2013   Thyroid  disease    Phreesia 02/26/2020    Past Surgical History:  Procedure Laterality Date   ABDOMINAL HYSTERECTOMY     heavy irreg bleeding   CHOLECYSTECTOMY     COLONOSCOPY N/A 10/09/2016   Procedure: COLONOSCOPY;  Surgeon: Shaaron Lamar HERO, MD;  Location[facility];  Service: Endoscopy;  Laterality: N/A;  9:30 AM   DILATION AND CURETTAGE OF UTERUS     ENDOMETRIAL ABLATION     SALPINGOOPHORECTOMY Bilateral 07/06/2013   Procedure: SALPINGO OOPHORECTOMY;  Surgeon: Vonn VEAR Inch, MD;  Location: [facility];  Service: Gynecology;  Laterality: Bilateral;   SUPRACERVICAL ABDOMINAL HYSTERECTOMY N/A 07/06/2013   Procedure: HYSTERECTOMY SUPRACERVICAL ABDOMINAL;  Surgeon: Vonn VEAR Inch, MD;  Location: [facility];  Service: Gynecology;  Laterality: N/A;    TUBAL LIGATION      Family History  Problem Relation Age of Onset   COPD Mother    Heart disease Mother        CHF   Hypertension Mother    Cancer Father        kidney met to lung   Heart attack Father 53       died of heart attack   Hypertension Maternal Grandmother    Heart disease Maternal Grandmother    Rheum arthritis Maternal Grandmother    Hypertension Maternal Grandfather    Heart disease Maternal Grandfather    Hypertension Paternal Grandmother    Heart disease Paternal Grandmother    Hypertension Paternal Grandfather    Heart disease Paternal Grandfather    Cancer Sister 17       lung,renal   Alcohol abuse Sister     Social History   Socioeconomic History   Marital status: Divorced    Spouse name: Not on file   Number of children: 2   Years of education: 16   Highest education level: Bachelor's degree (e.g., BA, AB, BS)  Occupational History   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 Second marriage.[employer], site.   2 daughters grown in [place]   7 grand and 1 great       Dog: LabDoodley: [place]      Enjoys: cruising, camp- being outside      Diet: eats all food groups    Caffeine: coffee- 16 oz; 16 oz   Water : 6-8 cups       Wears seat belt   Does not use phone while driving   Smoke Barrister's clerk in lock box   Social Drivers of Health   Financial Resource Strain: Medium Risk (07/29/2022)   Overall Financial Resource Strain (CARDIA)    Difficulty of Paying Living Expenses: Somewhat hard  Food Insecurity: No Food Insecurity (07/29/2022)   Hunger Vital Sign     Worried About Running Out of Food in the Last Year: Never true    Ran Out of Food in the Last Year: Never true  Transportation Needs: No Transportation Needs (07/29/2022)   PRAPARE - Administrator, Civil Service (Medical): No    Lack of Transportation (Non-Medical): No  Physical Activity: Insufficiently Active (07/29/2022)   Exercise Vital Sign    Days of Exercise per Week: 1 day    Minutes of Exercise per Session: 20 min  Stress: No Stress Concern Present (08/31/2020)   [employer] of Occupational Health - Occupational Stress Questionnaire    Feeling of Stress : Only a little  Social Connections: Moderately Isolated (07/29/2022)   Social Connection and Isolation Panel    Frequency of Communication with Friends and Family: More than three times a week    Frequency of Social Gatherings with Friends and Family: Once a week    Attends Religious Services: 1 to 4 times per year    Active Member of [employer] or Organizations: No    Attends Engineer, structural: Not on file    Marital Status: Divorced  Intimate Partner Violence: Not At Risk (02/28/2020)   Humiliation, Afraid, Rape, and Kick questionnaire    Fear of Current or Ex-Partner: No    Emotionally Abused: No    Physically Abused: No    Sexually Abused: No     Physical Exam   Vitals:   11/08/23 0607 11/08/23 0609  BP: 133/74   Pulse: 100 100  Resp: 15 15  Temp:    SpO2: 98% 97%    CONSTITUTIONAL: Well-appearing, NAD NEURO/PSYCH:  Alert and oriented x 3, no focal deficits EYES:  eyes equal and reactive ENT/NECK:  no LAD, no JVD CARDIO: Regular rate, well-perfused, normal S1 and S2 PULM:  CTAB no wheezing or rhonchi GI/GU:  non-distended, non-tender MSK/SPINE:  No gross deformities, no edema SKIN:  no rash, atraumatic   *Additional and/or pertinent findings included in MDM below  Diagnostic and Interventional Summary    EKG Interpretation Date/Time:  Sunday November 08 2023 02:44:08 EDT Ventricular  Rate:  82 PR Interval:  162 QRS Duration:  105 QT Interval:  402 QTC Calculation: 470 R Axis:   78  Text Interpretation: Sinus arrhythmia Low voltage, extremity and precordial leads RSR' in V1 or V2, probably normal variant Confirmed by Theadore Sharper [id number]) on 11/08/2023 3:12:52 AM       Labs Reviewed  CBC - Abnormal; Notable for the following components:  Result Value   Hemoglobin 15.6 (*)    MCHC 36.2 (*)    All other components within normal limits  COMPREHENSIVE METABOLIC PANEL WITH GFR - Abnormal; Notable for the following components:   Sodium 134 (*)    Glucose, Bld 140 (*)    All other components within normal limits  CBG MONITORING, ED - Abnormal; Notable for the following components:   Glucose-Capillary 137 (*)    All other components within normal limits  MAGNESIUM    CT HEAD WO CONTRAST ( )  Final Result      Medications  lactated ringers  bolus 1,000 mL (0 mLs Intravenous Stopped 11/08/23 0455)  prochlorperazine  (COMPAZINE ) injection 10 mg (10 mg Intravenous Given 11/08/23 0459)  diphenhydrAMINE  (BENADRYL ) injection 25 mg (25 mg Intravenous Given 11/08/23 0459)  sodium chloride  0.9 % bolus 1,000 mL (0 mLs Intravenous Stopped 11/08/23 0608)  meclizine  (ANTIVERT ) tablet 25 mg (25 mg Oral Given 11/08/23 0605)     Procedures  /  Critical Care Procedures  ED Course and Medical Decision Making  Initial Impression and Ddx Suspect dizziness related to orthostatic hypotension in the setting of dehydration given her poor p.o. intake recently.  Unclear cause of this, doubt emergent intra-abdominal process at this time given her very reassuring abdominal exam, soft and nontender with no rebound guarding or rigidity.  Patient is not having any abdominal pain, denies any vomiting or diarrhea.  Providing fluids and will reassess.  Past medical/surgical history that increases complexity of ED encounter: Hypertension  Interpretation of Diagnostics I personally reviewed the  EKG and my interpretation is as follows: Sinus rhythm  No significant blood count or electrolyte disturbance.  Patient Reassessment and Ultimate Disposition/Management     Patient complaining of headache.  Started within the past few hours.  CT head unremarkable.  With patient's significant other present I am gathering more history.  Somehow patient is changing her description of the dizziness, now saying that it is not a lightheaded sensation but a room spinning sensation.  Has had vertigo in the past and this feels similar but is never lasted this long and has never been this constant.  Recommended MRI imaging to exclude stroke.  Normal and symmetric strength and sensation, normal coordination, normal speech however would be difficult to exclude posterior circulation stroke given her continued symptoms.  Patient is refusing MRI.  Feels confident that she is not having a stroke as does her significant other.  I explained that they may be right but it is very difficult to tell without the MRI.  Plan is to reassess after meclizine , see if she can walk.  May need to rediscuss MRI versus admission versus leaving AMA.  Signed out to oncoming provider at shift change.  Patient management required discussion with the following services or consulting groups:  None  Complexity of Problems Addressed Acute illness or injury that poses threat of life of bodily function  Additional Data Reviewed and Analyzed Further history obtained from: Further history from spouse/family member  Additional Factors Impacting ED Encounter Risk Consideration of hospitalization  Nancy [name]. Theadore, MD [facility] Emergency Medicine [facility] [email or web address]  Final Clinical Impressions(s) / ED Diagnoses     ICD-10-CM   1. Dizziness  R42       ED Discharge Orders     None        Discharge Instructions Discussed with and Provided to Patient:   Discharge Instructions   None      Timber Marshman,  Nancy HERO, MD 11/08/23 [phone]

## 2023-11-08 NOTE — ED Provider Notes (Signed)
 I took over care of this patient at 7 AM. She has been having some vertigo type symptoms. She was just given meclizine  and is waiting for reassessment after some improvement in her symptoms. She declined MRI and any other further testing to rule out stroke.  Physical Exam  BP (!) 155/94   Pulse (!) 108   Temp 98.1 F (36.7 C) (Oral)   Resp 14   LMP 07/06/2013 Comment: serum pregnancy negative on 06/30/2013  SpO2 98%   Physical Exam Vitals and nursing note reviewed.  Constitutional:      General: She is not in acute distress.    Appearance: She is well-developed.  HENT:     Head: Normocephalic and atraumatic.  Eyes:     Conjunctiva/sclera: Conjunctivae normal.  Cardiovascular:     Rate and Rhythm: Normal rate and regular rhythm.     Heart sounds: No murmur heard. Pulmonary:     Effort: Pulmonary effort is normal. No respiratory distress.     Breath sounds: Normal breath sounds.  Abdominal:     Palpations: Abdomen is soft.     Tenderness: There is no abdominal tenderness.  Musculoskeletal:        General: No swelling.     Cervical back: Neck supple.  Skin:    General: Skin is warm and dry.     Capillary Refill: Capillary refill takes less than 2 seconds.  Neurological:     Mental Status: She is alert.  Psychiatric:        Mood and Affect: Mood normal.     Procedures  Procedures  ED Course / MDM    Medical Decision Making Patient's symptoms overall improved after meclizine .  She is able to ambulate without difficulty.  She was offered an MRI to rule out stroke and declined.  She wants to go home.  I will give her prescription for meclizine  and she is advised to return if she changes her mind and obtain very close follow-up.  Advise return for new or worsening symptoms.  Problems Addressed: Near syncope: undiagnosed new problem with uncertain prognosis Vertigo: undiagnosed new problem with uncertain prognosis  Risk OTC drugs. Prescription drug  management.         Gennaro Duwaine CROME, DO 11/08/23 [phone]

## 2023-11-08 NOTE — ED Notes (Signed)
 Pt ambulated one lap around ED with a standby assist, pt with steady gait, ambulated independently

## 2024-03-18 ENCOUNTER — Encounter: Admitting: Internal Medicine

## 2024-03-22 ENCOUNTER — Encounter: Payer: Self-pay | Admitting: Internal Medicine

## 2024-03-22 ENCOUNTER — Ambulatory Visit (INDEPENDENT_AMBULATORY_CARE_PROVIDER_SITE_OTHER): Admitting: Internal Medicine

## 2024-03-22 VITALS — BP 137/82 | HR 89 | Ht 67.0 in | Wt 221.0 lb

## 2024-03-22 DIAGNOSIS — Z23 Encounter for immunization: Secondary | ICD-10-CM | POA: Diagnosis not present

## 2024-03-22 DIAGNOSIS — Z0001 Encounter for general adult medical examination with abnormal findings: Secondary | ICD-10-CM

## 2024-03-22 DIAGNOSIS — R42 Dizziness and giddiness: Secondary | ICD-10-CM | POA: Insufficient documentation

## 2024-03-22 DIAGNOSIS — E039 Hypothyroidism, unspecified: Secondary | ICD-10-CM | POA: Diagnosis not present

## 2024-03-22 DIAGNOSIS — E782 Mixed hyperlipidemia: Secondary | ICD-10-CM

## 2024-03-22 DIAGNOSIS — R7303 Prediabetes: Secondary | ICD-10-CM | POA: Insufficient documentation

## 2024-03-22 DIAGNOSIS — I10 Essential (primary) hypertension: Secondary | ICD-10-CM

## 2024-03-22 DIAGNOSIS — E559 Vitamin D deficiency, unspecified: Secondary | ICD-10-CM

## 2024-03-22 DIAGNOSIS — G43109 Migraine with aura, not intractable, without status migrainosus: Secondary | ICD-10-CM

## 2024-03-22 MED ORDER — LOSARTAN POTASSIUM 50 MG PO TABS: 50.0000 mg | ORAL_TABLET | Freq: Every day | ORAL | 3 refills | Status: AC

## 2024-03-22 MED ORDER — ROSUVASTATIN CALCIUM 10 MG PO TABS: 10.0000 mg | ORAL_TABLET | Freq: Every day | ORAL | 3 refills | Status: AC

## 2024-03-22 MED ORDER — HYDROCHLOROTHIAZIDE 25 MG PO TABS: 25.0000 mg | ORAL_TABLET | Freq: Every day | ORAL | 3 refills | Status: AC

## 2024-03-22 MED ORDER — RIZATRIPTAN BENZOATE 10 MG PO TBDP: 10.0000 mg | ORAL_TABLET | ORAL | 1 refills | Status: AC | PRN

## 2024-03-22 NOTE — Assessment & Plan Note (Addendum)
 Had an episode of vertigo in 07/25 Reports history of vertigo in the past as well Was given meclizine  from ER, but did not require it Advised to perform simple vestibular exercises at home, material shown

## 2024-03-22 NOTE — Assessment & Plan Note (Signed)
 Lab Results  Component Value Date   HGBA1C 5.7 (H) 03/10/2023   Advised to follow DASH diet

## 2024-03-22 NOTE — Assessment & Plan Note (Signed)
Well-controlled now Rarely needs Maxalt

## 2024-03-22 NOTE — Assessment & Plan Note (Signed)
 BP Readings from Last 1 Encounters:  03/22/24 137/82   Well-controlled with losartan  50 mg QD and hydrochlorothiazide  25 mg QD Counseled for compliance with the medications Advised DASH diet and moderate exercise/walking, at least 150 mins/week

## 2024-03-22 NOTE — Assessment & Plan Note (Addendum)
 Physical examination as documented. Fasting blood tests reviewed. Flu vaccine today. Wants to think about pneumococcal and Shingrix vaccine for now.

## 2024-03-22 NOTE — Assessment & Plan Note (Signed)
 Lab Results  Component Value Date   TSH 4.310 07/08/2023   with normal free T4 On Levothyroxine  to 100 mcg QD, takes it on empty stomach Check TSH and free T4

## 2024-03-22 NOTE — Progress Notes (Signed)
 Established Patient Office Visit  Subjective:  Patient ID: Nancy [name], female    DOB: [birth year]  Age: 58 y.o. MRN: [id number]  CC:  Chief Complaint  Patient presents with   Annual Exam    8 month f/u and cpe needs flu shot today.    HPI Nancy [name] is a 58 y.o. female with past medical history of HTN, HLD and hypothyroidism who presents for f/u of her chronic medical conditions.  HTN: BP is well-controlled. Takes medications regularly. Patient denies headache, dizziness, chest pain, dyspnea or palpitations.  Hypothyroidism: She has been doing well with levothyroxine  100 mcg now.  Her TSH and free T4 were normal in 03/25. She does not have any palpitations now. Of note, she used to take NP thyroid  in the past.  She denies any fatigue, recent change in appetite or tremors currently.    Past Medical History:  Diagnosis Date   Anxiety    Depression    Essential hypertension 05/01/2017   GERD (gastroesophageal reflux disease)    Hyperlipidemia 05/01/2017   Hypertension    Hypothyroidism, adult 03/02/2019   Irregular bleeding 03/10/2013   Thyroid  disease    Phreesia 02/26/2020    Past Surgical History:  Procedure Laterality Date   ABDOMINAL HYSTERECTOMY     heavy irreg bleeding   CHOLECYSTECTOMY     COLONOSCOPY N/A 10/09/2016   Procedure: COLONOSCOPY;  Surgeon: Shaaron Lamar HERO, MD;  Location: [facility];  Service: Endoscopy;  Laterality: N/A;  9:30 AM   DILATION AND CURETTAGE OF UTERUS     ENDOMETRIAL ABLATION     SALPINGOOPHORECTOMY Bilateral 07/06/2013   Procedure: SALPINGO OOPHORECTOMY;  Surgeon: Vonn VEAR Inch, MD;  Location: [facility];  Service: Gynecology;  Laterality: Bilateral;   SUPRACERVICAL ABDOMINAL HYSTERECTOMY N/A 07/06/2013   Procedure: HYSTERECTOMY SUPRACERVICAL ABDOMINAL;  Surgeon: Vonn VEAR Inch, MD;  Location: [facility];  Service: Gynecology;  Laterality: N/A;   TUBAL LIGATION      Family History  Problem Relation Age of Onset   COPD Mother     Heart disease Mother        CHF   Hypertension Mother    Cancer Father        kidney met to lung   Heart attack Father 42       died of heart attack   Hypertension Maternal Grandmother    Heart disease Maternal Grandmother    Rheum arthritis Maternal Grandmother    Hypertension Maternal Grandfather    Heart disease Maternal Grandfather    Hypertension Paternal Grandmother    Heart disease Paternal Grandmother    Hypertension Paternal Grandfather    Heart disease Paternal Grandfather    Cancer Sister 84       lung,renal   Alcohol abuse Sister     Social History   Socioeconomic History   Marital status: Divorced    Spouse name: Not on file   Number of children: 2   Years of education: 16   Highest education level: Bachelor's degree (e.g., BA, AB, BS)  Occupational History   Occupation: Magazine Features Editor: [employer]    Comment: reading specialist   Tobacco Use   Smoking status: Never   Smokeless tobacco: Never  Substance and Sexual Activity   Alcohol use: No   Drug use: No   Sexual activity: Yes    Birth control/protection: Surgical  Other Topics Concern   Not on file  Social History Narrative   Lives w  husbandMarried for 3 years but dating for 17 plus. 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   2 daughters grown in [place]   7 grand and 1 great       Dog: LabDoodley: Liberty      Enjoys: cruising, camp- being outside      Diet: eats all food groups    Caffeine: coffee- 16 oz; 16 oz   Water : 6-8 cups       Wears seat belt   Does not use phone while driving   Smoke Barrister's Clerk in lock box   Social Drivers of Health   [name] Resource Strain: Low Risk  (03/18/2024)   Overall [name] Resource Strain (CARDIA)    Difficulty of Paying Living Expenses: Not very hard  Food Insecurity: No Food Insecurity (03/18/2024)   Hunger Vital Sign    Worried About Running Out of Food in the Last Year: Never true    Ran Out of Food in  the Last Year: Never true  Transportation Needs: No Transportation Needs (03/18/2024)   PRAPARE - Administrator, Civil Service (Medical): No    Lack of Transportation (Non-Medical): No  Physical Activity: Insufficiently Active (03/18/2024)   Exercise Vital Sign    Days of Exercise per Week: 1 day    Minutes of Exercise per Session: 20 min  Stress: Stress Concern Present (03/18/2024)   Nancy [name]    Feeling of Stress: To some extent  Social Connections: Moderately Integrated (03/18/2024)   Social Connection and Isolation Panel    Frequency of Communication with Friends and Family: More than three times a week    Frequency of Social Gatherings with Friends and Family: Once a week    Attends Religious Services: 1 to 4 times per year    Active Member of Nancy [name] or Organizations: Yes    Attends Banker Meetings: 1 to 4 times per year    Marital Status: Divorced  Intimate Partner Violence: Not At Risk (02/28/2020)   Humiliation, Afraid, Rape, and Kick [name]    Fear of Current or Ex-Partner: No    Emotionally Abused: No    Physically Abused: No    Sexually Abused: No    Outpatient Medications Prior to Visit  Medication Sig Dispense Refill   Cholecalciferol (VITAMIN D -3) 125 MCG (5000 UT) TABS Take 1 tablet by mouth daily.     levothyroxine  (SYNTHROID ) 100 MCG tablet Take 1 tablet (100 mcg total) by mouth daily. 90 tablet 2   meclizine  (ANTIVERT ) 25 MG tablet Take 1 tablet (25 mg total) by mouth 3 (three) times daily as needed for dizziness. 30 tablet 0   fluconazole  (DIFLUCAN ) 150 MG tablet Take one tablet today, repeat after 72 hours x2 for a total of 3 doses over the course of one week. 3 tablet 0   hydrochlorothiazide  (HYDRODIURIL ) 25 MG tablet Take 1 tablet (25 mg total) by mouth daily. 90 tablet 3   losartan  (COZAAR ) 50 MG tablet Take 1 tablet (50 mg total) by mouth daily. 90 tablet 3    nystatin  cream (MYCOSTATIN ) Apply 1 Application topically 2 (two) times daily. 30 g 1   rizatriptan  (MAXALT -MLT) 10 MG disintegrating tablet Take 1 tablet (10 mg total) by mouth as needed for migraine. May repeat in 2 hours if needed. Max of 2 tablets in 24 hour period. 10 tablet 0   rosuvastatin  (CRESTOR ) 10 MG tablet Take 1 tablet (10 mg  total) by mouth daily. 90 tablet 3   No facility-administered medications prior to visit.    No Known Allergies  ROS Review of Systems  Constitutional:  Negative for chills and fever.  HENT:  Negative for congestion, sinus pressure, sinus pain and sore throat.   Eyes:  Negative for pain and discharge.  Respiratory:  Negative for cough and shortness of breath.   Cardiovascular:  Negative for chest pain and palpitations.  Gastrointestinal:  Negative for abdominal pain, constipation, diarrhea, nausea and vomiting.  Endocrine: Negative for polydipsia and polyuria.  Genitourinary:  Negative for dysuria and hematuria.  Musculoskeletal:  Negative for neck pain and neck stiffness.  Skin:  Negative for rash.  Neurological:  Negative for dizziness and weakness.  Psychiatric/Behavioral:  Negative for agitation and behavioral problems.       Objective:    Physical Exam Vitals reviewed.  Constitutional:      General: She is not in acute distress.    Appearance: She is not diaphoretic.  HENT:     Head: Normocephalic and atraumatic.     Nose: No congestion.     Mouth/Throat:     Mouth: Mucous membranes are moist.     Pharynx: No posterior oropharyngeal erythema.  Eyes:     General: No scleral icterus.    Extraocular Movements: Extraocular movements intact.  Cardiovascular:     Rate and Rhythm: Normal rate and regular rhythm.     Heart sounds: Normal heart sounds. No murmur heard. Pulmonary:     Breath sounds: Normal breath sounds. No wheezing or rales.  Abdominal:     Palpations: Abdomen is soft.     Tenderness: There is no abdominal tenderness.   Musculoskeletal:     Cervical back: Neck supple. No tenderness.     Right lower leg: No edema.     Left lower leg: No edema.  Skin:    General: Skin is warm.     Findings: No rash.  Neurological:     General: No focal deficit present.     Mental Status: She is alert and oriented to person, place, and time.     Cranial Nerves: No cranial nerve deficit.     Sensory: No sensory deficit.     Motor: No weakness.  Psychiatric:        Mood and Affect: Mood normal.        Behavior: Behavior normal.     BP 137/82   Pulse 89   Ht 5' 7 (1.702 m)   Wt 221 lb (100.2 kg)   LMP 07/06/2013 Comment: serum pregnancy negative on 06/30/2013  SpO2 100%   BMI 34.61 kg/m  Wt Readings from Last 3 Encounters:  03/22/24 221 lb (100.2 kg)  07/13/23 219 lb 12.8 oz (99.7 kg)  03/17/23 225 lb 12.8 oz (102.4 kg)    Lab Results  Component Value Date   TSH 4.310 07/08/2023   Lab Results  Component Value Date   WBC 10.2 11/08/2023   HGB 15.6 (H) 11/08/2023   HCT 43.1 11/08/2023   MCV 86.5 11/08/2023   PLT 275 11/08/2023   Lab Results  Component Value Date   NA 134 (L) 11/08/2023   K 3.6 11/08/2023   CO2 22 11/08/2023   GLUCOSE 140 (H) 11/08/2023   BUN 14 11/08/2023   CREATININE 0.64 11/08/2023   BILITOT 0.9 11/08/2023   ALKPHOS 58 11/08/2023   AST 32 11/08/2023   ALT 41 11/08/2023   PROT 7.3 11/08/2023   ALBUMIN  4.4 11/08/2023   CALCIUM  9.6 11/08/2023   ANIONGAP 13 11/08/2023   EGFR 90 03/10/2023   Lab Results  Component Value Date   CHOL 153 03/10/2023   Lab Results  Component Value Date   HDL 59 03/10/2023   Lab Results  Component Value Date   LDLCALC 75 03/10/2023   Lab Results  Component Value Date   TRIG 107 03/10/2023   Lab Results  Component Value Date   CHOLHDL 2.6 03/10/2023   Lab Results  Component Value Date   HGBA1C 5.7 (H) 03/10/2023      Assessment & Plan:   Problem List Items Addressed This Visit       Cardiovascular and Mediastinum    Essential hypertension   BP Readings from Last 1 Encounters:  03/22/24 137/82   Well-controlled with losartan  50 mg QD and hydrochlorothiazide  25 mg QD Counseled for compliance with the medications Advised DASH diet and moderate exercise/walking, at least 150 mins/week      Relevant Medications   hydrochlorothiazide  (HYDRODIURIL ) 25 MG tablet   losartan  (COZAAR ) 50 MG tablet   rosuvastatin  (CRESTOR ) 10 MG tablet   Other Relevant Orders   CMP14+EGFR   CBC with Differential/Platelet   Migraine with aura   Well-controlled now Rarely needs Maxalt       Relevant Medications   hydrochlorothiazide  (HYDRODIURIL ) 25 MG tablet   losartan  (COZAAR ) 50 MG tablet   rosuvastatin  (CRESTOR ) 10 MG tablet   rizatriptan  (MAXALT -MLT) 10 MG disintegrating tablet     Endocrine   Hypothyroidism, adult   Lab Results  Component Value Date   TSH 4.310 07/08/2023   with normal free T4 On Levothyroxine  to 100 mcg QD, takes it on empty stomach Check TSH and free T4      Relevant Orders   TSH + free T4     Other   Hyperlipidemia   On Crestor  Check lipid profile      Relevant Medications   hydrochlorothiazide  (HYDRODIURIL ) 25 MG tablet   losartan  (COZAAR ) 50 MG tablet   rosuvastatin  (CRESTOR ) 10 MG tablet   Other Relevant Orders   Lipid panel   Encounter for general adult medical examination with abnormal findings - Primary   Physical examination as documented. Fasting blood tests reviewed. Flu vaccine today. Wants to think about pneumococcal and Shingrix vaccine for now.      Vertigo   Had an episode of vertigo in 07/25 Reports history of vertigo in the past as well Was given meclizine  from ER, but did not require it Advised to perform simple vestibular exercises at home, material shown       Prediabetes   Lab Results  Component Value Date   HGBA1C 5.7 (H) 03/10/2023   Advised to follow DASH diet      Relevant Orders   Hemoglobin A1c   Other Visit Diagnoses        Vitamin D  deficiency       Relevant Orders   VITAMIN D  25 Hydroxy (Vit-D Deficiency, Fractures)     Encounter for immunization       Relevant Orders   Flu vaccine trivalent PF, 6mos and older(Flulaval,Afluria,Fluarix,Fluzone) (Completed)        Meds ordered this encounter  Medications   hydrochlorothiazide  (HYDRODIURIL ) 25 MG tablet    Sig: Take 1 tablet (25 mg total) by mouth daily.    Dispense:  90 tablet    Refill:  3   losartan  (COZAAR ) 50 MG tablet    Sig: Take 1 tablet (  50 mg total) by mouth daily.    Dispense:  90 tablet    Refill:  3   rosuvastatin  (CRESTOR ) 10 MG tablet    Sig: Take 1 tablet (10 mg total) by mouth daily.    Dispense:  90 tablet    Refill:  3   rizatriptan  (MAXALT -MLT) 10 MG disintegrating tablet    Sig: Take 1 tablet (10 mg total) by mouth as needed for migraine. May repeat in 2 hours if needed. Max of 2 tablets in 24 hour period.    Dispense:  10 tablet    Refill:  1    Follow-up: Return in about 6 months (around 09/19/2024) for HTN and hypothyroidism.    Suzzane MARLA Blanch, MD

## 2024-03-22 NOTE — Patient Instructions (Addendum)
 Please continue to take medications as prescribed.  Please continue to follow low carb diet and perform moderate exercise/walking at least 150 mins/week.  Please perform simple vestibular exercises for vertigo. Inner Ear Balance Home Exercises to Treat Dizziness (Vestibular Home Exercises)

## 2024-03-22 NOTE — Assessment & Plan Note (Signed)
On Crestor Check lipid profile 

## 2024-03-23 ENCOUNTER — Ambulatory Visit: Payer: Self-pay | Admitting: Internal Medicine

## 2024-03-23 LAB — CMP14+EGFR
ALT: 45 IU/L — ABNORMAL HIGH (ref 0–32)
AST: 31 IU/L (ref 0–40)
Albumin: 4.6 g/dL (ref 3.8–4.9)
Alkaline Phosphatase: 65 IU/L (ref 49–135)
BUN/Creatinine Ratio: 13 (ref 9–23)
BUN: 11 mg/dL (ref 6–24)
Bilirubin Total: 0.6 mg/dL (ref 0.0–1.2)
CO2: 23 mmol/L (ref 20–29)
Calcium: 9.5 mg/dL (ref 8.7–10.2)
Chloride: 100 mmol/L (ref 96–106)
Creatinine, Ser: 0.82 mg/dL (ref 0.57–1.00)
Globulin, Total: 1.9 g/dL (ref 1.5–4.5)
Glucose: 96 mg/dL (ref 70–99)
Potassium: 3.8 mmol/L (ref 3.5–5.2)
Sodium: 138 mmol/L (ref 134–144)
Total Protein: 6.5 g/dL (ref 6.0–8.5)
eGFR: 83 mL/min/1.73 (ref >59)

## 2024-03-23 LAB — HEMOGLOBIN A1C
Est. average glucose Bld gHb Est-mCnc: 111 mg/dL
Hgb A1c MFr Bld: 5.5 % (ref 4.8–5.6)

## 2024-03-23 LAB — CBC WITH DIFFERENTIAL/PLATELET
Basophils Absolute: 0 x10E3/uL (ref 0.0–0.2)
Basos: 1 %
EOS (ABSOLUTE): 0.1 x10E3/uL (ref 0.0–0.4)
Eos: 2 %
Hematocrit: 41.7 % (ref 34.0–46.6)
Hemoglobin: 13.5 g/dL (ref 11.1–15.9)
Immature Grans (Abs): 0 x10E3/uL (ref 0.0–0.1)
Immature Granulocytes: 0 %
Lymphocytes Absolute: 2.2 x10E3/uL (ref 0.7–3.1)
Lymphs: 34 %
MCH: 30.3 pg (ref 26.6–33.0)
MCHC: 32.4 g/dL (ref 31.5–35.7)
MCV: 94 fL (ref 79–97)
Monocytes Absolute: 0.5 x10E3/uL (ref 0.1–0.9)
Monocytes: 8 %
Neutrophils Absolute: 3.4 x10E3/uL (ref 1.4–7.0)
Neutrophils: 54 %
Platelets: 254 x10E3/uL (ref 150–450)
RBC: 4.46 x10E6/uL (ref 3.77–5.28)
RDW: 12.4 % (ref 11.7–15.4)
WBC: 6.3 x10E3/uL (ref 3.4–10.8)

## 2024-03-23 LAB — LIPID PANEL
Chol/HDL Ratio: 2.7 ratio (ref 0.0–4.4)
Cholesterol, Total: 155 mg/dL (ref 100–199)
HDL: 58 mg/dL (ref >39)
LDL Chol Calc (NIH): 79 mg/dL (ref 0–99)
Triglycerides: 101 mg/dL (ref 0–149)
VLDL Cholesterol Cal: 18 mg/dL (ref 5–40)

## 2024-03-23 LAB — TSH+FREE T4
Free T4: 1.17 ng/dL (ref 0.82–1.77)
TSH: 4.02 u[IU]/mL (ref 0.450–4.500)

## 2024-03-23 LAB — VITAMIN D 25 HYDROXY (VIT D DEFICIENCY, FRACTURES): Vit D, 25-Hydroxy: 44.9 ng/mL (ref 30.0–100.0)

## 2024-03-23 MED ORDER — LEVOTHYROXINE SODIUM 100 MCG PO TABS: 100.0000 ug | ORAL_TABLET | Freq: Every day | ORAL | 2 refills | Status: AC

## 2024-03-23 NOTE — Addendum Note (Signed)
 Addended byBETHA TOBIE DOWNS on: 03/23/2024 06:47 AM   Modules accepted: Orders

## 2024-04-05 ENCOUNTER — Other Ambulatory Visit (HOSPITAL_COMMUNITY): Payer: Self-pay | Admitting: Internal Medicine

## 2024-04-05 DIAGNOSIS — Z1231 Encounter for screening mammogram for malignant neoplasm of breast: Secondary | ICD-10-CM

## 2024-05-11 ENCOUNTER — Ambulatory Visit (HOSPITAL_COMMUNITY)

## 2024-05-16 ENCOUNTER — Ambulatory Visit (HOSPITAL_COMMUNITY)
Admission: RE | Admit: 2024-05-16 | Discharge: 2024-05-16 | Disposition: A | Source: Ambulatory Visit | Attending: Internal Medicine

## 2024-05-16 ENCOUNTER — Encounter (HOSPITAL_COMMUNITY): Payer: Self-pay

## 2024-05-16 DIAGNOSIS — Z1231 Encounter for screening mammogram for malignant neoplasm of breast: Secondary | ICD-10-CM | POA: Insufficient documentation

## 2024-09-20 ENCOUNTER — Ambulatory Visit: Admitting: Internal Medicine
